# Patient Record
Sex: Male | Born: 1980 | Smoking: Former smoker
Health system: Southern US, Community
[De-identification: ages and names within clinical notes are randomized; demographics above are authoritative.]

---

## 2010-09-18 ENCOUNTER — Emergency Department (HOSPITAL_BASED_OUTPATIENT_CLINIC_OR_DEPARTMENT_OTHER)
Admission: EM | Admit: 2010-09-18 | Discharge: 2010-09-18 | Disposition: A | Discharge status: Home / Self Care | Payer: Self-pay | Attending: Emergency Medicine | Admitting: Emergency Medicine

## 2010-09-18 ENCOUNTER — Emergency Department (INDEPENDENT_AMBULATORY_CARE_PROVIDER_SITE_OTHER): Payer: Self-pay

## 2010-09-18 DIAGNOSIS — S8000XA Contusion of unspecified knee, initial encounter: Secondary | ICD-10-CM | POA: Insufficient documentation

## 2010-09-18 DIAGNOSIS — X58XXXA Exposure to other specified factors, initial encounter: Secondary | ICD-10-CM | POA: Insufficient documentation

## 2010-09-26 ENCOUNTER — Emergency Department (HOSPITAL_BASED_OUTPATIENT_CLINIC_OR_DEPARTMENT_OTHER)
Admission: EM | Admit: 2010-09-26 | Discharge: 2010-09-26 | Disposition: A | Discharge status: Home / Self Care | Payer: Self-pay | Attending: Emergency Medicine | Admitting: Emergency Medicine

## 2010-09-26 DIAGNOSIS — F17200 Nicotine dependence, unspecified, uncomplicated: Secondary | ICD-10-CM | POA: Insufficient documentation

## 2010-09-26 DIAGNOSIS — R109 Unspecified abdominal pain: Secondary | ICD-10-CM | POA: Insufficient documentation

## 2010-09-26 LAB — DIFFERENTIAL
Eosinophils Relative: 5 % (ref 0–5)
Lymphocytes Relative: 15 % (ref 12–46)
Lymphs Abs: 1.3 10*3/uL (ref 0.7–4.0)
Monocytes Relative: 9 % (ref 3–12)

## 2010-09-26 LAB — COMPREHENSIVE METABOLIC PANEL
ALT: 23 U/L (ref 0–53)
Alkaline Phosphatase: 78 U/L (ref 39–117)
BUN: 11 mg/dL (ref 6–23)
CO2: 26 mEq/L (ref 19–32)
GFR calc non Af Amer: >60 mL/min (ref >60)
Glucose, Bld: 100 mg/dL — ABNORMAL HIGH (ref 70–99)
Potassium: 4.3 mEq/L (ref 3.5–5.1)
Sodium: 144 mEq/L (ref 135–145)
Total Bilirubin: 0.7 mg/dL (ref 0.3–1.2)

## 2010-09-26 LAB — CBC
HCT: 43.4 % (ref 39.0–52.0)
MCH: 30.3 pg (ref 26.0–34.0)
MCV: 86.6 fL (ref 78.0–100.0)
RBC: 5.01 MIL/uL (ref 4.22–5.81)
RDW: 13.2 % (ref 11.5–15.5)
WBC: 8.9 10*3/uL (ref 4.0–10.5)

## 2010-09-26 LAB — HEMOCCULT GUIAC POC 1CARD (OFFICE): Fecal Occult Bld: NEGATIVE

## 2010-09-26 LAB — URINALYSIS, ROUTINE W REFLEX MICROSCOPIC
Bilirubin Urine: NEGATIVE
Hgb urine dipstick: NEGATIVE
Ketones, ur: NEGATIVE mg/dL
Specific Gravity, Urine: 1.024 (ref 1.005–1.030)
pH: 5.5 (ref 5.0–8.0)

## 2010-09-26 LAB — LIPASE, BLOOD: Lipase: 128 U/L (ref 23–300)

## 2010-10-09 ENCOUNTER — Emergency Department (HOSPITAL_BASED_OUTPATIENT_CLINIC_OR_DEPARTMENT_OTHER)
Admission: EM | Admit: 2010-10-09 | Discharge: 2010-10-09 | Disposition: A | Discharge status: Home / Self Care | Payer: Self-pay | Attending: Emergency Medicine | Admitting: Emergency Medicine

## 2010-10-09 DIAGNOSIS — F17200 Nicotine dependence, unspecified, uncomplicated: Secondary | ICD-10-CM | POA: Insufficient documentation

## 2010-10-09 DIAGNOSIS — IMO0002 Reserved for concepts with insufficient information to code with codable children: Secondary | ICD-10-CM | POA: Insufficient documentation

## 2010-10-26 ENCOUNTER — Emergency Department (HOSPITAL_BASED_OUTPATIENT_CLINIC_OR_DEPARTMENT_OTHER): Payer: Self-pay

## 2010-10-26 ENCOUNTER — Emergency Department (HOSPITAL_BASED_OUTPATIENT_CLINIC_OR_DEPARTMENT_OTHER)
Admission: EM | Admit: 2010-10-26 | Discharge: 2010-10-26 | Disposition: A | Discharge status: Home / Self Care | Payer: Self-pay | Attending: Emergency Medicine | Admitting: Emergency Medicine

## 2010-10-26 DIAGNOSIS — R197 Diarrhea, unspecified: Secondary | ICD-10-CM | POA: Insufficient documentation

## 2010-10-26 DIAGNOSIS — F17200 Nicotine dependence, unspecified, uncomplicated: Secondary | ICD-10-CM | POA: Insufficient documentation

## 2010-10-26 DIAGNOSIS — R109 Unspecified abdominal pain: Secondary | ICD-10-CM | POA: Insufficient documentation

## 2010-10-26 DIAGNOSIS — R112 Nausea with vomiting, unspecified: Secondary | ICD-10-CM | POA: Insufficient documentation

## 2010-10-26 LAB — COMPREHENSIVE METABOLIC PANEL
Albumin: 4.6 g/dL (ref 3.5–5.2)
Alkaline Phosphatase: 87 U/L (ref 39–117)
BUN: 11 mg/dL (ref 6–23)
GFR calc Af Amer: >60 mL/min (ref >60)
Potassium: 4.4 mEq/L (ref 3.5–5.1)
Sodium: 146 mEq/L — ABNORMAL HIGH (ref 135–145)
Total Protein: 8.5 g/dL — ABNORMAL HIGH (ref 6.0–8.3)

## 2010-10-26 LAB — DIFFERENTIAL
Basophils Absolute: 0 10*3/uL (ref 0.0–0.1)
Basophils Relative: 0 % (ref 0–1)
Eosinophils Absolute: 0.3 10*3/uL (ref 0.0–0.7)
Eosinophils Relative: 2 % (ref 0–5)

## 2010-10-26 LAB — CBC
MCV: 86.9 fL (ref 78.0–100.0)
Platelets: 253 10*3/uL (ref 150–400)
RDW: 12.7 % (ref 11.5–15.5)
WBC: 13.5 10*3/uL — ABNORMAL HIGH (ref 4.0–10.5)

## 2010-10-26 LAB — URINALYSIS, ROUTINE W REFLEX MICROSCOPIC
Glucose, UA: NEGATIVE mg/dL
Ketones, ur: NEGATIVE mg/dL
pH: 7.5 (ref 5.0–8.0)

## 2010-10-26 MED ORDER — IOHEXOL 300 MG/ML  SOLN: 100.0000 mL | Freq: Once | INTRAMUSCULAR | Status: DC | PRN

## 2010-12-19 ENCOUNTER — Emergency Department (HOSPITAL_BASED_OUTPATIENT_CLINIC_OR_DEPARTMENT_OTHER)
Admission: EM | Admit: 2010-12-19 | Discharge: 2010-12-19 | Disposition: A | Discharge status: Home / Self Care | Payer: Self-pay | Attending: Emergency Medicine | Admitting: Emergency Medicine

## 2010-12-19 DIAGNOSIS — IMO0002 Reserved for concepts with insufficient information to code with codable children: Secondary | ICD-10-CM | POA: Insufficient documentation

## 2010-12-19 DIAGNOSIS — F17200 Nicotine dependence, unspecified, uncomplicated: Secondary | ICD-10-CM | POA: Insufficient documentation

## 2011-01-01 ENCOUNTER — Emergency Department (HOSPITAL_BASED_OUTPATIENT_CLINIC_OR_DEPARTMENT_OTHER)
Admission: EM | Admit: 2011-01-01 | Discharge: 2011-01-01 | Disposition: A | Discharge status: Home / Self Care | Payer: Self-pay | Attending: Emergency Medicine | Admitting: Emergency Medicine

## 2011-01-01 DIAGNOSIS — K0381 Cracked tooth: Secondary | ICD-10-CM | POA: Insufficient documentation

## 2011-01-01 DIAGNOSIS — IMO0002 Reserved for concepts with insufficient information to code with codable children: Secondary | ICD-10-CM | POA: Insufficient documentation

## 2011-01-01 DIAGNOSIS — X58XXXA Exposure to other specified factors, initial encounter: Secondary | ICD-10-CM | POA: Insufficient documentation

## 2012-10-01 ENCOUNTER — Emergency Department (HOSPITAL_BASED_OUTPATIENT_CLINIC_OR_DEPARTMENT_OTHER)
Admission: EM | Admit: 2012-10-01 | Discharge: 2012-10-01 | Disposition: A | Discharge status: Home / Self Care | Payer: Self-pay | Attending: Emergency Medicine | Admitting: Emergency Medicine

## 2012-10-01 ENCOUNTER — Emergency Department (HOSPITAL_BASED_OUTPATIENT_CLINIC_OR_DEPARTMENT_OTHER): Payer: Self-pay

## 2012-10-01 ENCOUNTER — Encounter (HOSPITAL_BASED_OUTPATIENT_CLINIC_OR_DEPARTMENT_OTHER): Payer: Self-pay | Admitting: *Deleted

## 2012-10-01 DIAGNOSIS — F17200 Nicotine dependence, unspecified, uncomplicated: Secondary | ICD-10-CM | POA: Insufficient documentation

## 2012-10-01 DIAGNOSIS — R112 Nausea with vomiting, unspecified: Secondary | ICD-10-CM | POA: Insufficient documentation

## 2012-10-01 DIAGNOSIS — J189 Pneumonia, unspecified organism: Secondary | ICD-10-CM

## 2012-10-01 DIAGNOSIS — R062 Wheezing: Secondary | ICD-10-CM | POA: Insufficient documentation

## 2012-10-01 DIAGNOSIS — R509 Fever, unspecified: Secondary | ICD-10-CM | POA: Insufficient documentation

## 2012-10-01 DIAGNOSIS — R29898 Other symptoms and signs involving the musculoskeletal system: Secondary | ICD-10-CM | POA: Insufficient documentation

## 2012-10-01 DIAGNOSIS — R630 Anorexia: Secondary | ICD-10-CM | POA: Insufficient documentation

## 2012-10-01 DIAGNOSIS — J159 Unspecified bacterial pneumonia: Secondary | ICD-10-CM | POA: Insufficient documentation

## 2012-10-01 DIAGNOSIS — IMO0001 Reserved for inherently not codable concepts without codable children: Secondary | ICD-10-CM | POA: Insufficient documentation

## 2012-10-01 DIAGNOSIS — R609 Edema, unspecified: Secondary | ICD-10-CM | POA: Insufficient documentation

## 2012-10-01 DIAGNOSIS — R5381 Other malaise: Secondary | ICD-10-CM | POA: Insufficient documentation

## 2012-10-01 DIAGNOSIS — R599 Enlarged lymph nodes, unspecified: Secondary | ICD-10-CM | POA: Insufficient documentation

## 2012-10-01 DIAGNOSIS — R51 Headache: Secondary | ICD-10-CM | POA: Insufficient documentation

## 2012-10-01 DIAGNOSIS — R0789 Other chest pain: Secondary | ICD-10-CM | POA: Insufficient documentation

## 2012-10-01 DIAGNOSIS — J3489 Other specified disorders of nose and nasal sinuses: Secondary | ICD-10-CM | POA: Insufficient documentation

## 2012-10-01 DIAGNOSIS — J029 Acute pharyngitis, unspecified: Secondary | ICD-10-CM | POA: Insufficient documentation

## 2012-10-01 MED ORDER — IBUPROFEN 800 MG PO TABS
800.0000 mg | ORAL_TABLET | Freq: Once | ORAL | Status: AC
  Administered 2012-10-01: 800 mg via ORAL
  Filled 2012-10-01: qty 1

## 2012-10-01 MED ORDER — ALBUTEROL SULFATE (5 MG/ML) 0.5% IN NEBU
5.0000 mg | INHALATION_SOLUTION | Freq: Once | RESPIRATORY_TRACT | Status: AC
  Administered 2012-10-01: 5 mg via RESPIRATORY_TRACT
  Filled 2012-10-01: qty 1

## 2012-10-01 MED ORDER — SODIUM CHLORIDE 0.9 % IV BOLUS (SEPSIS)
1000.0000 mL | Freq: Once | INTRAVENOUS | Status: AC
  Administered 2012-10-01: 1000 mL via INTRAVENOUS

## 2012-10-01 MED ORDER — LEVOFLOXACIN 500 MG PO TABS: 500.0000 mg | ORAL_TABLET | Freq: Every day | ORAL | Status: DC

## 2012-10-01 MED ORDER — LEVOFLOXACIN 500 MG PO TABS
500.0000 mg | ORAL_TABLET | Freq: Once | ORAL | Status: AC
  Administered 2012-10-01: 500 mg via ORAL
  Filled 2012-10-01: qty 1

## 2012-10-01 NOTE — ED Notes (Signed)
States he feels like he has a fever. Temp 98.7

## 2012-10-01 NOTE — ED Notes (Signed)
Cough fever headache fever and chills.

## 2012-10-01 NOTE — ED Provider Notes (Signed)
History     CSN: [id number]  Arrival date & time 10/01/12  1939   First MD Initiated Contact with Patient 10/01/12 1944      Chief Complaint  Patient presents with  . Cough    (Consider location/radiation/quality/duration/timing/severity/associated sxs/prior treatment) HPI Comments: 32 y/o male presents to the ED complaining of cough, headache, subjective fever and chills x 5 days. Cough is productive with dark brown/green phlegm. Admits to associated nausea, vomiting, myalgias and arthralgias. He vomited a few times over the past 5 days and has a decreased appetite. Also complaining of congestion and sore throat. Has been taking BC powders without any relief. Denies sick contacts. Did not have flu vaccine this year.  Patient is a 32 y.o. male presenting with cough. The history is provided by the patient.  Cough Associated symptoms: chills, fever, headaches, myalgias and sore throat   Associated symptoms: no chest pain, no ear pain and no rash     History reviewed. No pertinent past medical history.  History reviewed. No pertinent past surgical history.  No family history on file.  History  Substance Use Topics  . Smoking status: Current Some Day Smoker  . Smokeless tobacco: Not on file  . Alcohol Use: No      Review of Systems  Constitutional: Positive for fever, chills, appetite change and fatigue.  HENT: Positive for sore throat. Negative for ear pain, trouble swallowing, neck pain and neck stiffness.   Respiratory: Positive for cough and chest tightness.   Cardiovascular: Negative for chest pain.  Gastrointestinal: Positive for nausea and vomiting.  Musculoskeletal: Positive for myalgias and arthralgias.  Skin: Negative for rash.  Neurological: Positive for weakness and headaches.  All other systems reviewed and are negative.    Allergies  Review of patient's allergies indicates no known allergies.  Home Medications  No current outpatient prescriptions on  file.  BP 152/85  Pulse 92  Temp(Src) 99.6 F (37.6 C) (Oral)  Resp 18  SpO2 96%  Physical Exam  Nursing note and vitals reviewed. Constitutional: He is oriented to person, place, and time. He appears well-developed and well-nourished. No distress.  Appears fatigued.  HENT:  Head: Normocephalic and atraumatic.  Nose: Mucosal edema present. Right sinus exhibits frontal sinus tenderness. Left sinus exhibits frontal sinus tenderness.  Mouth/Throat: Uvula is midline and mucous membranes are normal. Posterior oropharyngeal erythema present. No oropharyngeal exudate or posterior oropharyngeal edema.  Eyes: Conjunctivae are normal.  Neck: Normal range of motion. Neck supple.  Cardiovascular: Normal rate, regular rhythm, normal heart sounds and intact distal pulses.   Pulmonary/Chest: Effort normal. No respiratory distress. He has wheezes (scattered expiratory). He has rhonchi (scattered, more prominent left lower lung fields).  Abdominal: Soft. Bowel sounds are normal. He exhibits no distension. There is no tenderness.  Musculoskeletal: Normal range of motion. He exhibits no edema.  Lymphadenopathy:    He has cervical adenopathy.  Neurological: He is alert and oriented to person, place, and time.  Skin: Skin is warm and dry. No rash noted. He is not diaphoretic.  Psychiatric: He has a normal mood and affect. His behavior is normal.    ED Course  Procedures (including critical care time)  Labs Reviewed - No data to display Dg Chest 2 View  10/01/2012  *RADIOLOGY REPORT*  Clinical Data: Cough.  Chest congestion.  Shortness of breath.  4- day history of these symptoms.  CHEST - 2 VIEW  Comparison: None.  Findings: Suboptimal inspiration accounts for crowded bronchovascular markings, especially  in the lung bases, and accentuates the cardiac silhouette.  Taking this into account, cardiomediastinal silhouette unremarkable.  Airspace consolidation in the posteromedial left lower lobe.  Lungs  otherwise clear.  No pleural effusions.  Visualized bony thorax intact.  IMPRESSION: Suboptimal inspiration.  Left lower lobe pneumonia.   Original Report Authenticated By: Hulan Saas, M.D.      1. CAP (community acquired pneumonia)       MDM  32 y/o male with CAP. He does not meet criteria for admission, low PORT score of 31. Rx levaquin. No respiratory distress. Return precautions discussed. Patient states understanding of plan and is agreeable. Resource guide given for PCP follow up in 1 week for recheck.       Trevor Mace, PA-C 10/01/12 2206

## 2012-10-01 NOTE — ED Provider Notes (Signed)
Medical screening examination/treatment/procedure(s) were performed by non-physician practitioner and as supervising physician I was immediately available for consultation/collaboration.   Gwyneth Sprout, MD 10/01/12 2317

## 2012-10-02 ENCOUNTER — Telehealth (HOSPITAL_BASED_OUTPATIENT_CLINIC_OR_DEPARTMENT_OTHER): Payer: Self-pay | Admitting: *Deleted

## 2012-10-02 NOTE — ED Notes (Signed)
Mrs. Retzloff called and stated that her husband, Cody Banks, was seen here last night and given a prescription for Levaquin for CAP.  States the prescription cost 110.00 and they can not afford it.  Requests to have a different cheaper prescription.  Chart reviewed with Dr. Judd Lien.  Order received for a Zpack.  Prescription was written by Dr. Judd Lien and left out front for pick up.  Call returned to Mrs. Sebree @ [phone] with instructions for use.  Verbalized understanding.

## 2012-10-22 ENCOUNTER — Emergency Department (HOSPITAL_BASED_OUTPATIENT_CLINIC_OR_DEPARTMENT_OTHER): Payer: Self-pay

## 2012-10-22 ENCOUNTER — Encounter (HOSPITAL_BASED_OUTPATIENT_CLINIC_OR_DEPARTMENT_OTHER): Payer: Self-pay | Admitting: *Deleted

## 2012-10-22 ENCOUNTER — Emergency Department (HOSPITAL_BASED_OUTPATIENT_CLINIC_OR_DEPARTMENT_OTHER)
Admission: EM | Admit: 2012-10-22 | Discharge: 2012-10-23 | Disposition: A | Discharge status: Home / Self Care | Payer: Self-pay | Attending: Emergency Medicine | Admitting: Emergency Medicine

## 2012-10-22 DIAGNOSIS — R197 Diarrhea, unspecified: Secondary | ICD-10-CM | POA: Insufficient documentation

## 2012-10-22 DIAGNOSIS — Z87891 Personal history of nicotine dependence: Secondary | ICD-10-CM | POA: Insufficient documentation

## 2012-10-22 DIAGNOSIS — Z8701 Personal history of pneumonia (recurrent): Secondary | ICD-10-CM | POA: Insufficient documentation

## 2012-10-22 DIAGNOSIS — R509 Fever, unspecified: Secondary | ICD-10-CM | POA: Insufficient documentation

## 2012-10-22 DIAGNOSIS — R000 Tachycardia, unspecified: Secondary | ICD-10-CM | POA: Insufficient documentation

## 2012-10-22 DIAGNOSIS — R112 Nausea with vomiting, unspecified: Secondary | ICD-10-CM | POA: Insufficient documentation

## 2012-10-22 DIAGNOSIS — J020 Streptococcal pharyngitis: Secondary | ICD-10-CM | POA: Insufficient documentation

## 2012-10-22 LAB — COMPREHENSIVE METABOLIC PANEL
ALT: 45 U/L (ref 0–53)
AST: 22 U/L (ref 0–37)
Albumin: 4.1 g/dL (ref 3.5–5.2)
Alkaline Phosphatase: 79 U/L (ref 39–117)
Chloride: 100 mEq/L (ref 96–112)
Potassium: 3.7 mEq/L (ref 3.5–5.1)
Total Bilirubin: 0.5 mg/dL (ref 0.3–1.2)

## 2012-10-22 LAB — RAPID STREP SCREEN (MED CTR MEBANE ONLY): Streptococcus, Group A Screen (Direct): POSITIVE — AB

## 2012-10-22 MED ORDER — PENICILLIN G BENZATHINE 1200000 UNIT/2ML IM SUSP
1.2000 10*6.[IU] | Freq: Once | INTRAMUSCULAR | Status: AC
  Administered 2012-10-23: 1.2 10*6.[IU] via INTRAMUSCULAR
  Filled 2012-10-22: qty 2

## 2012-10-22 MED ORDER — IBUPROFEN 800 MG PO TABS
ORAL_TABLET | ORAL | Status: AC
  Filled 2012-10-22: qty 1

## 2012-10-22 MED ORDER — KETOROLAC TROMETHAMINE 30 MG/ML IJ SOLN
30.0000 mg | Freq: Once | INTRAMUSCULAR | Status: AC
  Administered 2012-10-22: 30 mg via INTRAVENOUS
  Filled 2012-10-22: qty 1

## 2012-10-22 MED ORDER — ONDANSETRON HCL 4 MG/2ML IJ SOLN
4.0000 mg | Freq: Once | INTRAMUSCULAR | Status: AC
  Administered 2012-10-22: 4 mg via INTRAVENOUS
  Filled 2012-10-22: qty 2

## 2012-10-22 MED ORDER — IBUPROFEN 800 MG PO TABS
800.0000 mg | ORAL_TABLET | Freq: Once | ORAL | Status: AC
  Administered 2012-10-22: 800 mg via ORAL

## 2012-10-22 MED ORDER — SODIUM CHLORIDE 0.9 % IV BOLUS (SEPSIS)
1000.0000 mL | Freq: Once | INTRAVENOUS | Status: AC
  Administered 2012-10-22: 1000 mL via INTRAVENOUS

## 2012-10-22 NOTE — ED Notes (Signed)
Pt c/o n/v/d with sore throat and h/a x 1 day

## 2012-10-22 NOTE — ED Provider Notes (Signed)
History    This chart was scribed for Glynn Octave, MD by Leone Payor, ED Scribe. This patient was seen in room MH07/[facility]07 and the patient's care was started 10:14 PM.   CSN: [id number]  Arrival date & time 10/22/12  1949   First MD Initiated Contact with Patient 10/22/12 2200      Chief Complaint  Patient presents with  . Emesis  . Sore Throat  . Headache     The history is provided by the patient. No language interpreter was used.    Cody Banks is a 32 y.o. male who presents to the Emergency Department complaining of new, constant, unchanged nausea, vomiting, diarrhea starting 1-2 days ago. Pt states he has vomited about 10-15 times. He has associated fever, gradual onset HA, and sore throat. Pt was seen for pneumonia last month. He denies having any sick contacts. He denies abdominal pain.   Pt is a former smoker but denies alcohol use.  History reviewed. No pertinent past medical history.  History reviewed. No pertinent past surgical history.  History reviewed. No pertinent family history.  History  Substance Use Topics  . Smoking status: Former Games developer  . Smokeless tobacco: Not on file  . Alcohol Use: No      Review of Systems A complete 10 system review of systems was obtained and all systems are negative except as noted in the HPI and PMH.   Allergies  Review of patient's allergies indicates no known allergies.  Home Medications   Current Outpatient Rx  Name  Route  Sig  Dispense  Refill  . HYDROcodone-acetaminophen (LORTAB) 7.5-500 MG/15ML solution   Oral   Take 15 mLs by mouth every 6 (six) hours as needed for pain.   120 mL   0   . levofloxacin (LEVAQUIN) 500 MG tablet   Oral   Take 1 tablet (500 mg total) by mouth daily.   7 tablet   0     BP 125/74  Pulse 84  Temp(Src) 99.5 F (37.5 C) (Oral)  Resp 16  Ht 6\' 1"  (1.854 m)  Wt 305 lb (138.347 kg)  BMI 40.25 kg/m2  SpO2 100%  Physical Exam  Nursing note and vitals  reviewed. Constitutional: He appears well-developed and well-nourished.  HENT:  Head: Normocephalic and atraumatic.  Right Ear: External ear normal.  Left Ear: External ear normal.  Mouth/Throat: Oropharyngeal exudate present.  Bilateral tonsillar exudate. Uvula midline, no asymmetry.  Eyes: Conjunctivae are normal. Pupils are equal, round, and reactive to light.  Neck: Neck supple. No tracheal deviation present. No thyromegaly present.  No meningismus  Cardiovascular: Regular rhythm.   No murmur heard. tachycardia  Pulmonary/Chest: Effort normal and breath sounds normal.  Abdominal: Soft. Bowel sounds are normal. He exhibits no distension. There is no tenderness.  Musculoskeletal: Normal range of motion. He exhibits no edema and no tenderness.  Neurological: He is alert. Coordination normal.  Skin: Skin is warm and dry. No rash noted.  Psychiatric: He has a normal mood and affect.    ED Course  Procedures (including critical care time)  DIAGNOSTIC STUDIES: Oxygen Saturation is 100% on room air, normal by my interpretation.    COORDINATION OF CARE: 10:14 PM-Discussed treatment plan with pt at bedside and pt agreed to plan.    Labs Reviewed  RAPID STREP SCREEN - Abnormal; Notable for the following:    Streptococcus, Group A Screen (Direct) POSITIVE (*)    All other components within normal limits  COMPREHENSIVE METABOLIC PANEL -  Abnormal; Notable for the following:    Glucose, Bld 110 (*)    GFR calc non Af Amer 88 (*)    All other components within normal limits  CBC WITH DIFFERENTIAL   Dg Chest 2 View  10/22/2012  *RADIOLOGY REPORT*  Clinical Data: Headache.  Sore throat.  Emesis.  CHEST - 2 VIEW  Comparison: 10/01/2012  Findings: Left lower lobe airspace opacity improved but not totally resolved. Low lung volumes are present, causing crowding of the pulmonary vasculature.  Cardiac and mediastinal contours appear unremarkable.  No pleural effusion noted.  IMPRESSION:  1.   Band-like left lower lobe airspace opacity improved but not completely resolved.  This may reflect a small amount of residual pneumonia.   Original Report Authenticated By: Gaylyn Rong, M.D.       1. Streptococcal pharyngitis       MDM  2 day history of fever, vomiting, sore throat, headache.  Treated for PNA a month ago. Febrile and tachycardic on arrival. Headache gradual onset.  OP with bilateral exudates, no asymmetry.  Lungs clear, no meningismus.  IVF, bicillin, antiemetics. Improvement in HR with fluids and antipyretics. Now 84 from 120s.  No CP or SOB. Treat for strep. Oral hydration at home. No vomiting in ED. Return precautions discussed.      I personally performed the services described in this documentation, which was scribed in my presence. The recorded information has been reviewed and is accurate.   Glynn Octave, MD 10/23/12 1134

## 2012-10-23 MED ORDER — HYDROCODONE-ACETAMINOPHEN 7.5-500 MG/15ML PO SOLN: 15.0000 mL | Freq: Four times a day (QID) | ORAL | Status: DC | PRN

## 2012-10-23 MED ORDER — IBUPROFEN 800 MG PO TABS
ORAL_TABLET | ORAL | Status: AC
  Administered 2012-10-23: 800 mg
  Filled 2012-10-23: qty 1

## 2013-05-24 IMAGING — CR DG CHEST 2V
2 series · 2 of 2 positions shown · non-contrast
Comparison: 10/01/2012

CLINICAL DATA: Headache.  Sore throat.  Emesis.

CHEST - 2 VIEW

[w chest pa]
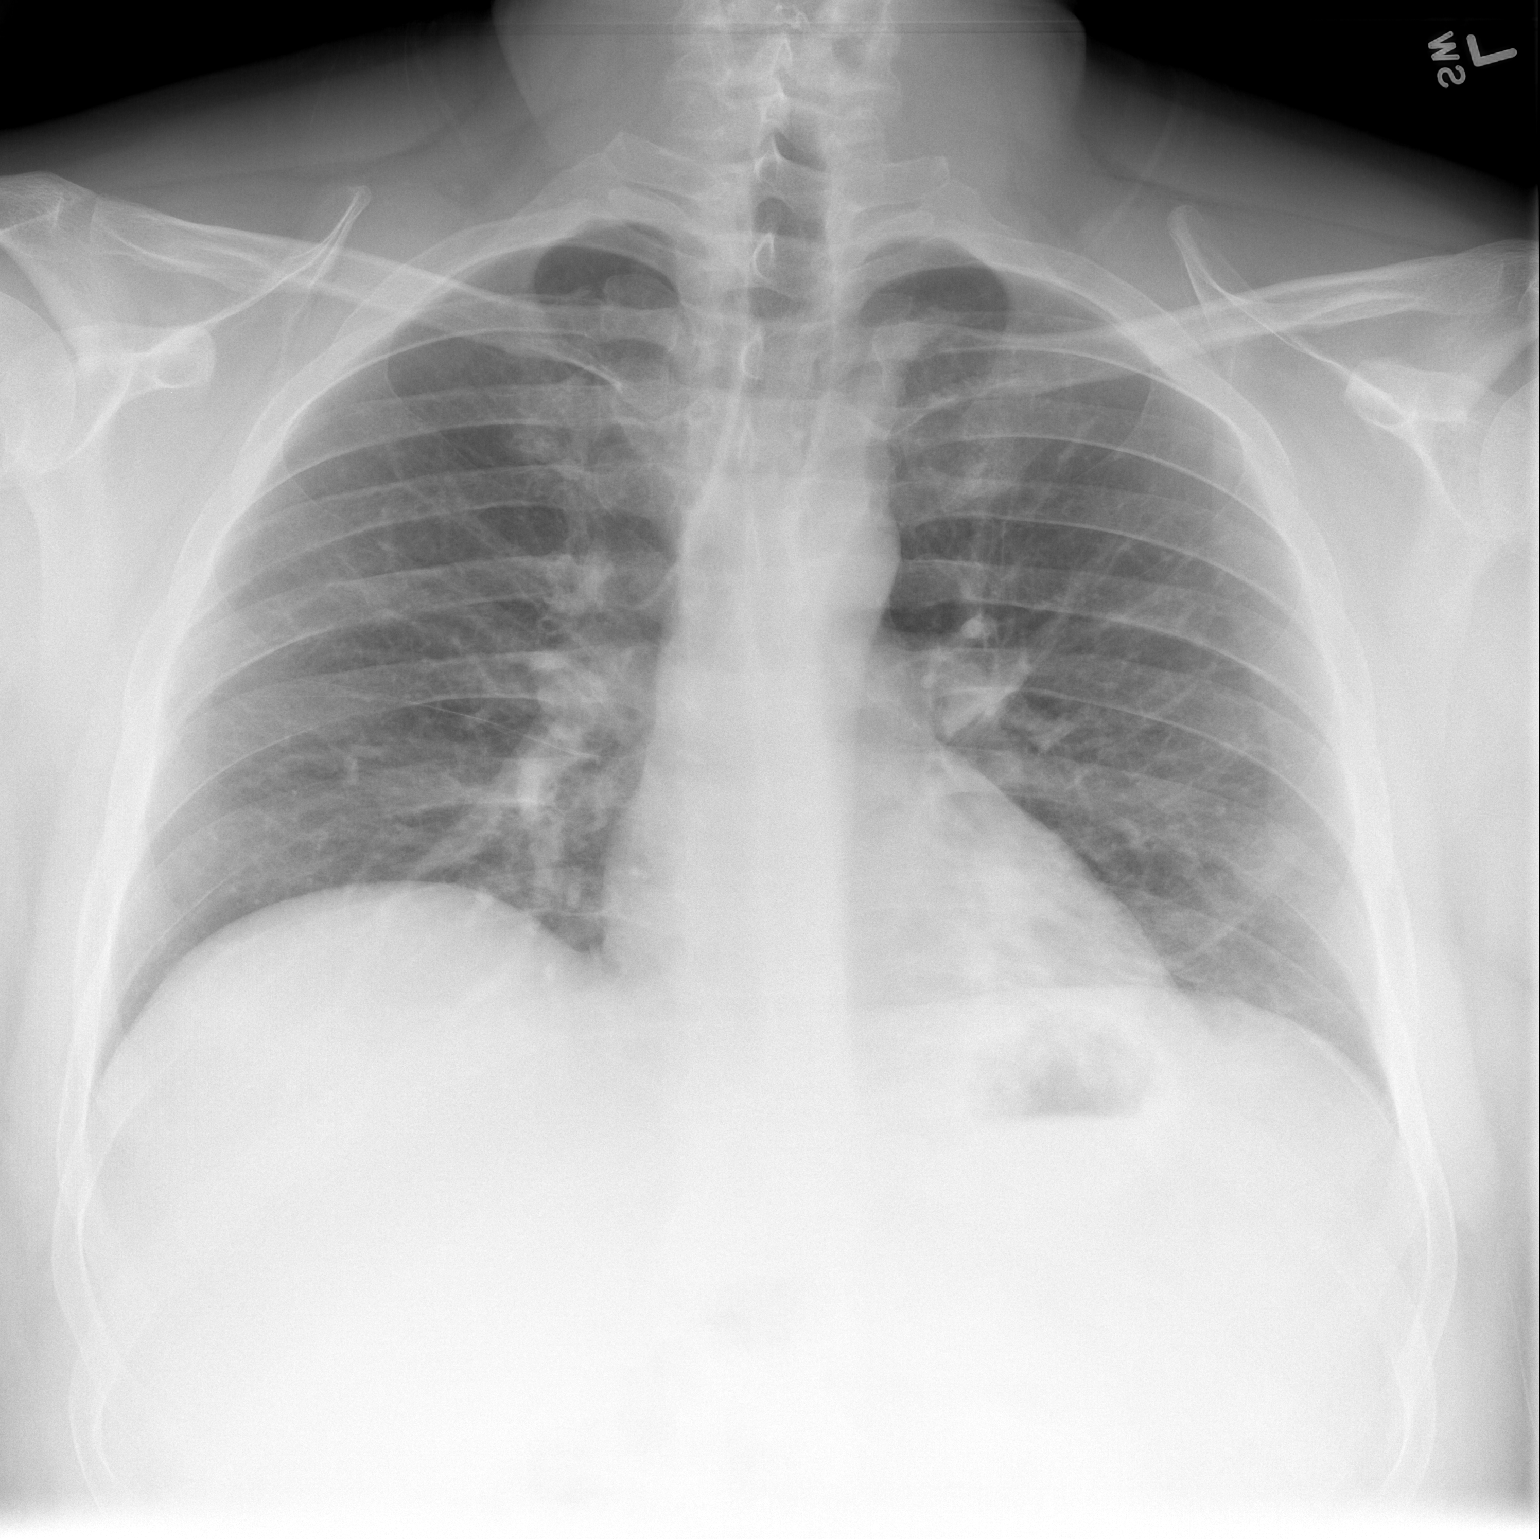

[w chest lat]
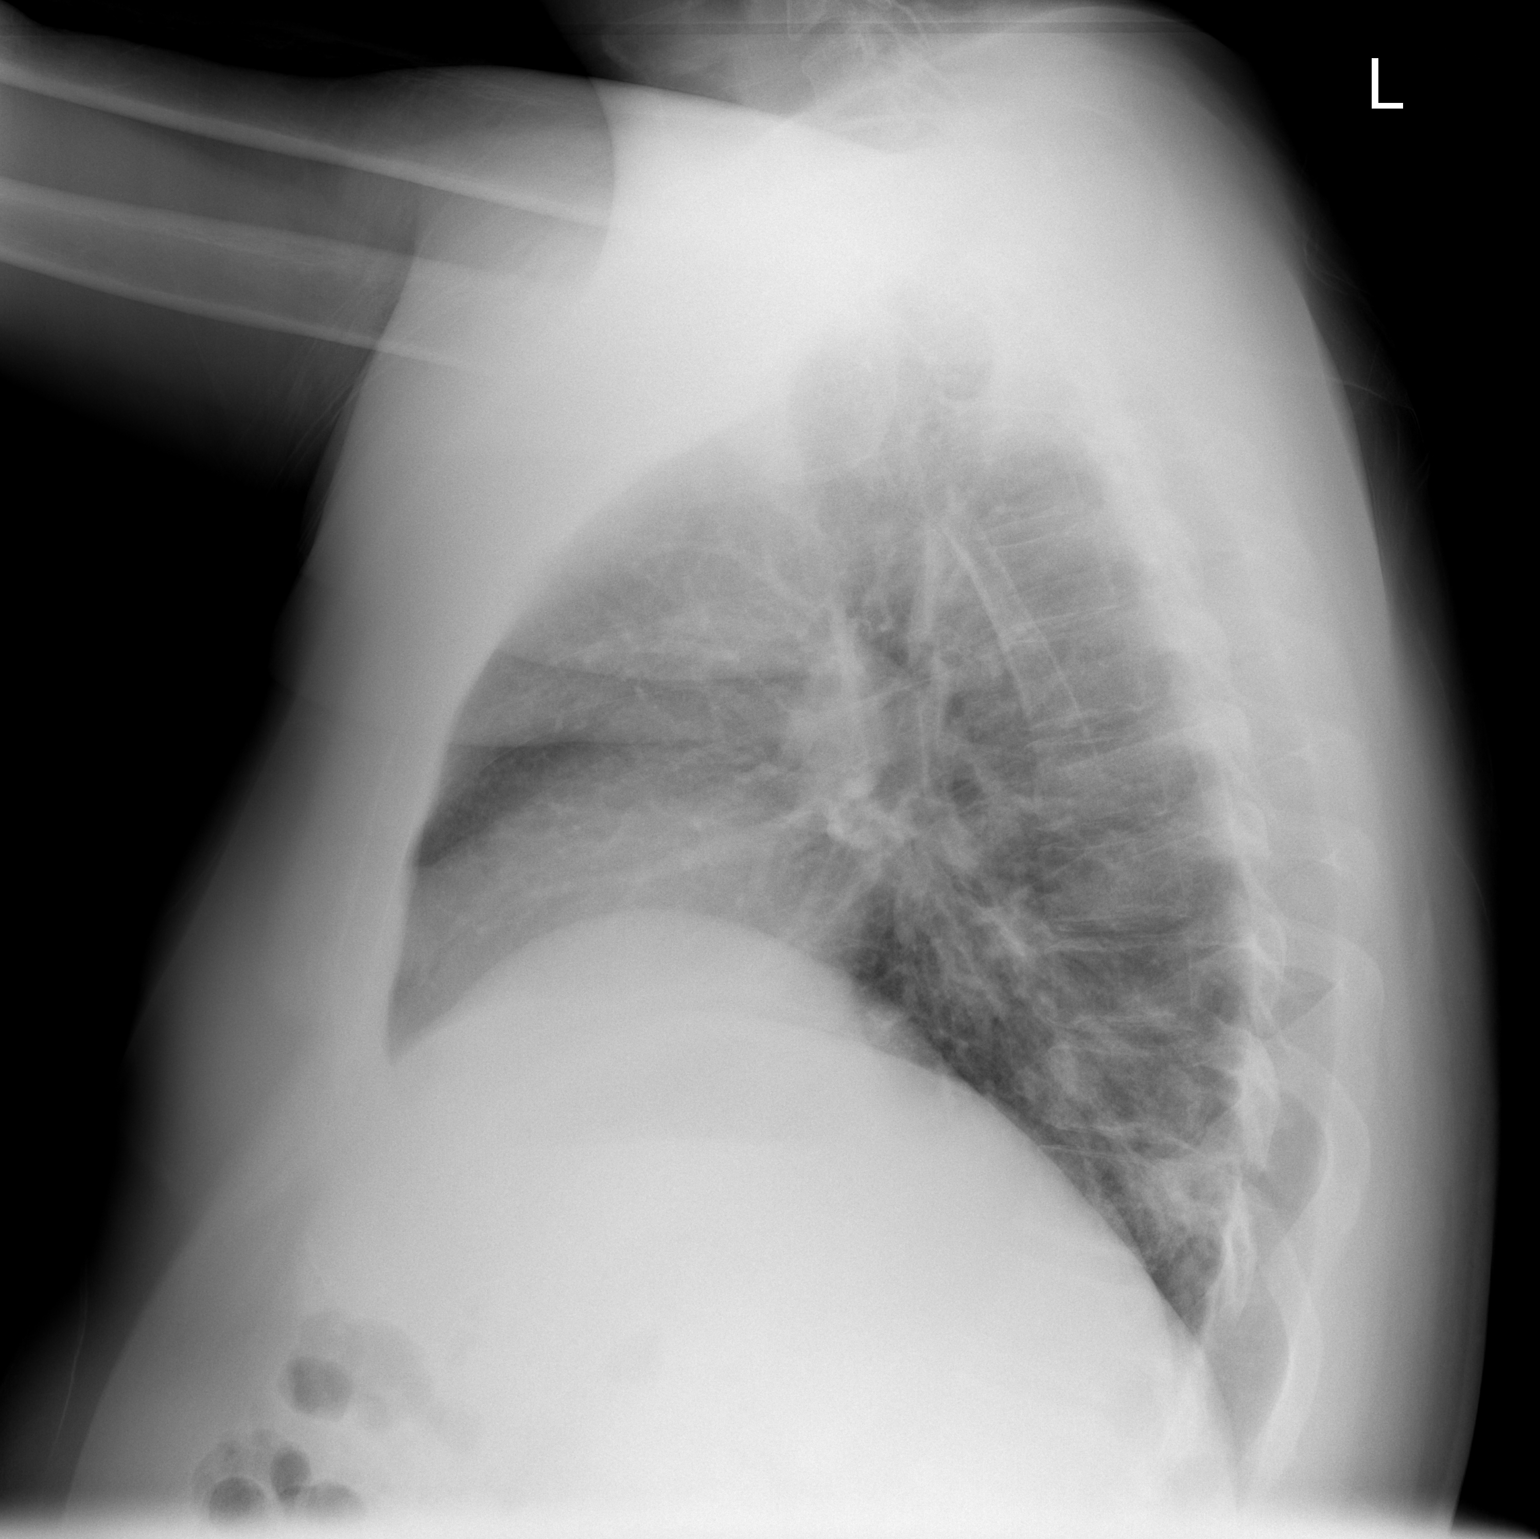

[2 of 2 positions shown; findings below may reference images not displayed]

FINDINGS: Left lower lobe airspace opacity improved but not totally
resolved. Low lung volumes are present, causing crowding of the
pulmonary vasculature.  Cardiac and mediastinal contours appear
unremarkable.

No pleural effusion noted.
IMPRESSION: 1.  Band-like left lower lobe airspace opacity improved but not
completely resolved.  This may reflect a small amount of residual
pneumonia.

## 2013-12-02 ENCOUNTER — Encounter (HOSPITAL_BASED_OUTPATIENT_CLINIC_OR_DEPARTMENT_OTHER): Payer: Self-pay | Admitting: Emergency Medicine

## 2013-12-02 ENCOUNTER — Emergency Department (HOSPITAL_BASED_OUTPATIENT_CLINIC_OR_DEPARTMENT_OTHER)
Admission: EM | Admit: 2013-12-02 | Discharge: 2013-12-02 | Disposition: A | Discharge status: Home / Self Care | Payer: Self-pay | Attending: Emergency Medicine | Admitting: Emergency Medicine

## 2013-12-02 DIAGNOSIS — M542 Cervicalgia: Secondary | ICD-10-CM | POA: Insufficient documentation

## 2013-12-02 DIAGNOSIS — R5383 Other fatigue: Secondary | ICD-10-CM

## 2013-12-02 DIAGNOSIS — L0201 Cutaneous abscess of face: Secondary | ICD-10-CM | POA: Insufficient documentation

## 2013-12-02 DIAGNOSIS — Z87891 Personal history of nicotine dependence: Secondary | ICD-10-CM | POA: Insufficient documentation

## 2013-12-02 DIAGNOSIS — R5381 Other malaise: Secondary | ICD-10-CM | POA: Insufficient documentation

## 2013-12-02 DIAGNOSIS — L03211 Cellulitis of face: Principal | ICD-10-CM

## 2013-12-02 DIAGNOSIS — Z79899 Other long term (current) drug therapy: Secondary | ICD-10-CM | POA: Insufficient documentation

## 2013-12-02 DIAGNOSIS — R509 Fever, unspecified: Secondary | ICD-10-CM | POA: Insufficient documentation

## 2013-12-02 DIAGNOSIS — R638 Other symptoms and signs concerning food and fluid intake: Secondary | ICD-10-CM | POA: Insufficient documentation

## 2013-12-02 DIAGNOSIS — Z791 Long term (current) use of non-steroidal anti-inflammatories (NSAID): Secondary | ICD-10-CM | POA: Insufficient documentation

## 2013-12-02 MED ORDER — CEPHALEXIN 500 MG PO CAPS: 500.0000 mg | ORAL_CAPSULE | Freq: Two times a day (BID) | ORAL | Status: DC

## 2013-12-02 MED ORDER — SULFAMETHOXAZOLE-TRIMETHOPRIM 800-160 MG PO TABS: 1.0000 | ORAL_TABLET | Freq: Two times a day (BID) | ORAL | Status: DC

## 2013-12-02 MED ORDER — SODIUM CHLORIDE 0.9 % IV BOLUS (SEPSIS): 1000.0000 mL | Freq: Once | INTRAVENOUS | Status: DC

## 2013-12-02 MED ORDER — SULFAMETHOXAZOLE-TMP DS 800-160 MG PO TABS
1.0000 | ORAL_TABLET | Freq: Once | ORAL | Status: AC
  Administered 2013-12-02: 1 via ORAL
  Filled 2013-12-02: qty 1

## 2013-12-02 MED ORDER — ACETAMINOPHEN 500 MG PO TABS
1000.0000 mg | ORAL_TABLET | Freq: Once | ORAL | Status: AC
  Administered 2013-12-02: 1000 mg via ORAL
  Filled 2013-12-02: qty 2

## 2013-12-02 MED ORDER — CEPHALEXIN 250 MG PO CAPS
500.0000 mg | ORAL_CAPSULE | Freq: Once | ORAL | Status: AC
  Administered 2013-12-02: 500 mg via ORAL
  Filled 2013-12-02: qty 2

## 2013-12-02 NOTE — ED Provider Notes (Signed)
CSN: [id number]     Arrival date & time 12/02/13  1910 History   First MD Initiated Contact with Patient 12/02/13 2107     Chief Complaint  Patient presents with  . Abscess   HPI  Cody Banks is a 33 y.o. male with no PMH who presents to the ED for evaluation of an abscess.  History was provided by the patient. Patient states he has had an abscess to the right side of his face for 3 days. Abscess is in front of his right ear. States he has had a knot in the area for 3-4 years, but it worsened after cutting himself with a razor last week. States it has been getting progressively more painful, swollen, and red. He states he tried popping it with some purulent drainage. He tried taking some Keflex from his mom's leftover prescription but this did not help. He states he has not been feeling well with fevers (max 101), chills, fatigue, myalgias, and generalized weakness. Patient states he has had abscess in the past. He denies any sore throat, rhinorrhea, ear pain, ear discharge, dizziness, lightheadedness, trismus, or difficulty swallowing/breathing. He states he has had spreading swelling to his neck. No neck stiffness.    History reviewed. No pertinent past medical history. History reviewed. No pertinent past surgical history. History reviewed. No pertinent family history. History  Substance Use Topics  . Smoking status: Former Games developer  . Smokeless tobacco: Not on file  . Alcohol Use: No    Review of Systems  Constitutional: Positive for fever, chills, activity change, appetite change and fatigue.  HENT: Negative for congestion, ear discharge, ear pain, rhinorrhea, sore throat, trouble swallowing and voice change.   Respiratory: Negative for cough and shortness of breath.   Cardiovascular: Negative for chest pain and leg swelling.  Gastrointestinal: Negative for nausea, vomiting and abdominal pain.  Musculoskeletal: Positive for myalgias and neck pain. Negative for neck stiffness.  Skin:  Positive for color change. Negative for wound.  Neurological: Positive for weakness (generalized ) and headaches. Negative for dizziness and light-headedness.    Allergies  Review of patient's allergies indicates no known allergies.  Home Medications   Prior to Admission medications   Medication Sig Start Date End Date Taking? Authorizing Provider  FLUoxetine (PROZAC) 40 MG capsule Take 40 mg by mouth daily.   Yes Historical Provider, MD  meloxicam (MOBIC) 7.5 MG tablet Take 7.5 mg by mouth daily.   Yes Historical Provider, MD   BP 157/87  Pulse 92  Temp(Src) 100.6 F (38.1 C)  Resp 16  Ht 6\' 1"  (1.854 m)  Wt 260 lb (117.935 kg)  BMI 34.31 kg/m2  SpO2 99%  Filed Vitals:   12/02/13 1922 12/02/13 2242  BP: 157/87 117/70  Pulse: 92 90  Temp: 100.6 F (38.1 C) 98.9 F (37.2 C)  TempSrc:  Oral  Resp: 16 16  Height: 6\' 1"  (1.854 m)   Weight: 260 lb (117.935 kg)   SpO2: 99% 96%    Physical Exam  Nursing note and vitals reviewed. Constitutional: He is oriented to person, place, and time. He appears well-developed and well-nourished. No distress.  Non-toxic  HENT:  Head: Normocephalic and atraumatic.    Left Ear: External ear normal.  Nose: Nose normal.  Mouth/Throat: Oropharynx is clear and moist.  Large superficial 4 cm x 4 cm circular area of erythema, edema and fluctuance to the right pre-auricular area with no open wound or drainage. Area tender to palpation. Mild edema to the  angle of the mandible on the right. No erythema to the posterior pharynx. Tonsils without edema or exudates. Uvula midline. No trismus. No difficulty controlling secretions. Tympanic membranes gray and translucent bilaterally with no erythema, edema, or hemotympanum.  No mastoid or tragal tenderness bilaterally.   Eyes: Conjunctivae are normal. Pupils are equal, round, and reactive to light. Right eye exhibits no discharge. Left eye exhibits no discharge.  Neck: Normal range of motion. Neck  supple.  No nuchal rigidity   Cardiovascular: Normal rate, regular rhythm and normal heart sounds.  Exam reveals no gallop and no friction rub.   No murmur heard. Pulmonary/Chest: Effort normal and breath sounds normal. No respiratory distress. He has no wheezes. He has no rales. He exhibits no tenderness.  Abdominal: Soft.  Musculoskeletal: Normal range of motion. He exhibits no edema and no tenderness.  Neurological: He is alert and oriented to person, place, and time.  Skin: Skin is warm and dry. He is not diaphoretic.    ED Course  INCISION AND DRAINAGE Date/Time: 12/02/2013 10:30 PM Performed by: Coral CeoPALMER, Marcy Sookdeo K Authorized by: Jillyn LedgerPALMER, Elsie Sakuma K Consent: Verbal consent obtained. Consent given by: patient Patient identity confirmed: verbally with patient Type: abscess Body area: head/neck Location details: face Anesthesia: local infiltration Local anesthetic: lidocaine 2% without epinephrine Anesthetic total: 4 ml Patient sedated: no Scalpel size: 11 Incision type: single straight Complexity: simple Drainage: purulent Drainage amount: copious Wound treatment: drain placed Packing material: 1/4 in iodoform gauze Patient tolerance: Patient tolerated the procedure well with no immediate complications.   (including critical care time) Labs Review Labs Reviewed - No data to display  Imaging Review No results found.   EKG Interpretation None       MDM   Cody Banks is a 33 y.o. male with no PMH who presents to the ED for evaluation of an abscess, which was drained in the ED. Patient has low grade fever 100.6 which reduced in the ED. Doubt septic infection. Patient non-toxic. Doubt deep cervical tissue infection. Abscess superficial. No meningeal signs. Wound care discussed with patient. Given first dose of antibiotics in the ED. Return for drain removal in 2 days and wound re-check. Return precautions, discharge instructions, and follow-up was discussed with the  patient before discharge.     Discharge Medication List as of 12/02/2013 10:35 PM    START taking these medications   Details  cephALEXin (KEFLEX) 500 MG capsule Take 1 capsule (500 mg total) by mouth 2 (two) times daily., Starting 12/02/2013, Until Discontinued, Print    sulfamethoxazole-trimethoprim (SEPTRA DS) 800-160 MG per tablet Take 1 tablet by mouth every 12 (twelve) hours., Starting 12/02/2013, Until Discontinued, Print        Final impressions: 1. Facial abscess   2. Fever      Luiz IronJessica Katlin Demeshia Sherburne PA-C   This patient was discussed with Dr. Azell DerJames        Cody Convey K Maytte Jacot, PA-C 12/03/13 [phone]

## 2013-12-02 NOTE — ED Notes (Signed)
Pt c/o abscess to right side of face x 3 days Pt request NO narcotics

## 2013-12-02 NOTE — Discharge Instructions (Signed)
Take antibiotics for full dose - even if your symptoms are improving  Take Tylenol for fever and pain  Warm compresses to the area several times a day - this will help with drainage  Return to the emergency department if you develop any changing/worsening condition, continued fever, increased drainage, spreading redness/swelling, neck stiffness, feeling worse, or any other concerns (please read additional information regarding your condition below)  Abscess An abscess is an infected area that contains a collection of pus and debris.It can occur in almost any part of the body. An abscess is also known as a furuncle or boil. CAUSES  An abscess occurs when tissue gets infected. This can occur from blockage of oil or sweat glands, infection of hair follicles, or a minor injury to the skin. As the body tries to fight the infection, pus collects in the area and creates pressure under the skin. This pressure causes pain. People with weakened immune systems have difficulty fighting infections and get certain abscesses more often.  SYMPTOMS Usually an abscess develops on the skin and becomes a painful mass that is red, warm, and tender. If the abscess forms under the skin, you may feel a moveable soft area under the skin. Some abscesses break open (rupture) on their own, but most will continue to get worse without care. The infection can spread deeper into the body and eventually into the bloodstream, causing you to feel ill.  DIAGNOSIS  Your caregiver will take your medical history and perform a physical exam. A sample of fluid may also be taken from the abscess to determine what is causing your infection. TREATMENT  Your caregiver may prescribe antibiotic medicines to fight the infection. However, taking antibiotics alone usually does not cure an abscess. Your caregiver may need to make a small cut (incision) in the abscess to drain the pus. In some cases, gauze is packed into the abscess to reduce pain and  to continue draining the area. HOME CARE INSTRUCTIONS   Only take over-the-counter or prescription medicines for pain, discomfort, or fever as directed by your caregiver.  If you were prescribed antibiotics, take them as directed. Finish them even if you start to feel better.  If gauze is used, follow your caregiver's directions for changing the gauze.  To avoid spreading the infection:  Keep your draining abscess covered with a bandage.  Wash your hands well.  Do not share personal care items, towels, or whirlpools with others.  Avoid skin contact with others.  Keep your skin and clothes clean around the abscess.  Keep all follow-up appointments as directed by your caregiver. SEEK MEDICAL CARE IF:   You have increased pain, swelling, redness, fluid drainage, or bleeding.  You have muscle aches, chills, or a general ill feeling.  You have a fever. MAKE SURE YOU:   Understand these instructions.  Will watch your condition.  Will get help right away if you are not doing well or get worse. Document Released: 03/30/2005 Document Revised: 12/20/2011 Document Reviewed: 09/02/2011 [facility] Patient Information 2014 [place], [place].   Emergency Department Resource Guide 1) Find a Doctor and Pay Out of Pocket Although you won't have to find out who is covered by your insurance plan, it is a good idea to ask around and get recommendations. You will then need to call the office and see if the doctor you have chosen will accept you as a new patient and what types of options they offer for patients who are self-pay. Some doctors offer discounts  or will set up payment plans for their patients who do not have insurance, but you will need to ask so you aren't surprised when you get to your appointment.  2) Contact Your Local Health Department Not all health departments have doctors that can see patients for sick visits, but many do, so it is worth a call to see if yours does. If you don't  know where your local health department is, you can check in your phone book. The CDC also has a tool to help you locate your state's health department, and many state websites also have listings of all of their local health departments.  3) Find a Walk-in Clinic If your illness is not likely to be very severe or complicated, you may want to try a walk in clinic. These are popping up all over the country in pharmacies, drugstores, and shopping centers. They're usually staffed by nurse practitioners or physician assistants that have been trained to treat common illnesses and complaints. They're usually fairly quick and inexpensive. However, if you have serious medical issues or chronic medical problems, these are probably not your best option.  No Primary Care Doctor: - Call Health Connect at  [phone] - they can help you locate a primary care doctor that  accepts your insurance, provides certain services, etc. - Physician Referral Service- [phone]  Chronic Pain Problems: Organization         Address  Phone   Notes  [facility]  [phone] Patients need to be referred by their primary care doctor.   Medication Assistance: Organization         Address  Phone   Notes  [facility] Medication [facility] [address] [place], [place] [address] [phone] --Must be a resident of [place] -- Must have NO insurance coverage whatsoever (no Medicaid/ Medicare, etc.) -- The pt. MUST have a primary care doctor that directs their care regularly and follows them in the community   MedAssist  [phone]   [employer]  [phone]) [phone]    Agencies that provide inexpensive medical care: Organization         Address  Phone   Notes  [facility]  [phone]   [facility]    [phone]   [facility] [address] [place], [place] [address] [phone]   [facility]  [place] [address], [place] [phone]   [facility]anned Parenthood    [phone]   [facility]    [phone]) [phone]   Community Health and [facility]  [address], [place] Phone:  [phone], Fax:  [phone] Hours of Operation:  9 am - 6 pm, M-F.  Also accepts Medicaid/Medicare and self-pay.  [facility]  [address], [place] Phone: [phone], Fax: [phone]. Hours of Operation:  8:30 am - 5:30 pm, M-F.  Also accepts Medicaid and self-pay.  [facility] [place] [address], [place] Phone: [phone]   [facility] [address], [place], [place] [phone], Ext. [phone] Mondays & Thursdays: 7-9 AM.  First 15 patients are seen on a first come, first serve basis.    Medicaid-accepting [facility] Providers:  Organization         Address  Phone   Notes  [facility] [address], [address], [place] (  [phone] Also accepts self-pay patients.  [facility] [address] Laurell Josephs [place], [place]  [phone]   [facility] [address], [place] [phone]   [facility] [address] [place] [phone]   Renaye Rakers [address], [place]   [phone] Only accepts [place] Access [place] patients after they have their name applied to their card.   Self-Pay (no insurance) in [facility]:  Organization         Address  Phone   Notes  Sickle Cell Patients, [facility] [address] [place], [place] [phone]   [facility] [address] [place], [place] [phone]   [facility]  [address], [place] [phone]   [facility]/Dr. Osei-Bonsu  [address] [place] or [address], [place] [phone] Phone  number for both [place] and [place] locations is the same.  Urgent Medical and [facility] [address], [place] [phone]   [facility] [address] [place] or [address] [phone]   [facility] [address] [place] [phone], phone; [phone], fax Sees patients 1st and 3rd Saturday of every month.  Must not qualify for public or private insurance (i.e. Medicaid, Medicare, [place] Health Choice, Veterans' Benefits)  Household income should be no more than 200% of the poverty level The clinic cannot treat you if you are pregnant or think you are pregnant  Sexually transmitted diseases are not treated at the clinic.    Dental Care: Organization         Address  Phone  Notes  [facility] Department of [facility] [address] [place], [place] [phone] Accepts children up to age 15 who are enrolled in [place] or [place] Health Choice; pregnant women with a Medicaid card; and children who have applied for Medicaid or [place] Health Choice, but were declined, whose parents can pay a reduced fee at time of service.  [facility] of [facility]  [address], [place] [phone] Accepts children up to age 92 who are enrolled in [place] or [place] Health Choice; pregnant women with a Medicaid card; and children who have applied for Medicaid or [place] Health Choice, but were declined, whose parents can pay a reduced fee at time of service.  [facility]  [address] [place], [place] [phone] Patients are seen by appointment only. Walk-ins are not accepted. [facility] will see patients 67 years of age and older. Monday - Tuesday (8am-5pm) Most Wednesdays (8:30-5pm) $30 per visit, cash only  [facility] Adult [facility] Access PROGRAM  [address], [facility] [phone] Patients are seen by appointment only.  Walk-ins are not accepted. [facility] will see patients 58 years of age and older. One Wednesday Evening (Monthly: Volunteer Based).  $30 per visit, cash only  [employer]  [phone] for adults; Children under age 62, call Graduate Pediatric Dentistry at [phone]. Children aged 6-14, please call [phone] to request a pediatric application.  Dental services are provided in all areas of dental care including fillings, crowns and bridges, complete and partial dentures, implants, gum treatment, root canals, and extractions. Preventive care is also  provided. Treatment is provided to both adults and children. Patients are selected via a lottery and there is often a waiting list.   [facility] [address] [place]  [phone] [email or web address]   [facility] [address] [place], [place] [phone], Ext. [phone] Second and Fourth Thursday of each month, opens at 6:30 AM; Clinic ends at 9 AM.  Patients are seen on a first-come first-served basis, and a limited number are seen during each clinic.   [facility]  [address] Ether Griffins [place], [place] [phone]   Eligibility Requirements You must have lived in [place], [place], or [place] counties for at least the last three months.   You cannot be eligible for state or federal sponsored [place], including [employer], [place], or [employer].   You generally cannot be eligible for healthcare insurance through your employer.    How to apply: Eligibility screenings are held every Tuesday and Wednesday afternoon from 1:00 pm until 4:00 pm. You do not need an appointment for the interview!  [facility] [address], [place], [place] [phone]   [facility] Health Department  [phone]   [facility] Health Department  [phone]   [facility] Health Department  [phone]    Behavioral Health  Resources in the Community: Intensive Outpatient Programs Organization         Address  Phone  Notes  [facility] [address] [place], [place] [phone]   [facility] Out[facility] [address] [place], [place] [phone]   [facility]: [facility] [address] [place], [place]  [phone]   [facility] [address]  [place], [place] [phone] or [phone]   Substance Abuse Resources Organization         Address  Phone  Notes  Alcohol [facility] Drug Services  [phone]   [facility]  [phone]   [facility]  [phone]   Floydene Flock  [phone]   Residential & Outpatient Substance Abuse Program  [phone]   Psychological Services Organization         Address  Phone  Notes  [facility]  [phone]   [facility] Services  [phone]   [facility] : [facility] Mental Health [address] [place] [phone] or [phone]    Mobile Crisis Teams Organization         Address  Phone  Notes  [facility], Mobile Crisis Care Unit  [phone]   [facility]  [address] [place], [place] [phone]   Doristine Locks [address] [place] [phone]    Self-Help/Support Groups Organization         Address  Phone             Notes  [facility] of [place] - variety of support groups  336- I7437963 Call for more information  Narcotics Anonymous (NA), [facility] [address], [employer] [place]  2 meetings at this location   Statistician         Address  Phone  Notes  [facility] [address],    [place]  [phone]   [facility]  [address], [place] [address], [place], [place] [phone]   [facility] [address] [place], [place] [phone] Admissions: 8am-3pm M-F  [facility]cent[facility] Substance Abuse  Treatment Center [address]  [place], [place]   [facility] [address] Jadene Pierini [place], [place]   The [place].,  [place], [place]   [facility] - Intensive Outpatient [address], [place], [place]   [facility] [address]  [place], [place] [phone] or [phone]   [facility] ([facility]) [address] [place], [place] Accepts Medicaid  [facility] [place] [address],  [place] [phone] Substance Abuse/Addiction Treatment   [facility] Organization         Address  Phone  Notes  [facility]  [phone]   Domenic Schwab, PhD [address] Arlis Porta [place], [place]   [phone] or [phone]   [facility] [place], [place] [phone]   [facility] [address], [place], [place] [phone] Insurance/Medicaid/sponsorship through [facility] and Families [address] [place] [address]                                    [place], [place] [phone] [place] [address], [place] [phone]    Dr. Adele Schilder  [phone]   [facility] Dept. 1) [address], [place] 2) [place] 3)  [place] [address], [place] [phone]  [phone]   [place] [phone] or [phone] (After Hours)

## 2013-12-17 NOTE — ED Provider Notes (Signed)
Medical screening examination/treatment/procedure(s) were conducted as a shared visit with non-physician practitioner(s) and myself.  I personally evaluated the patient during the encounter.   EKG Interpretation None      Pt seen and evaluated face to face.  Spontaneous swelling to lt lateral face.  Exam shows fluctuance c/w abscess.  Pt re-examinedafter I&D.  Improved.  Reviewed plan with patient.  Rolland PorterMark Joeangel Jeanpaul, MD 12/17/13 2142

## 2015-01-13 ENCOUNTER — Observation Stay (HOSPITAL_COMMUNITY)
Admission: RE | Admit: 2015-01-13 | Discharge: 2015-01-14 | Disposition: A | Discharge status: Home / Self Care | Payer: 59 | Attending: Family | Admitting: Family

## 2015-01-13 ENCOUNTER — Encounter (HOSPITAL_COMMUNITY): Payer: Self-pay | Admitting: *Deleted

## 2015-01-13 DIAGNOSIS — G8929 Other chronic pain: Secondary | ICD-10-CM | POA: Insufficient documentation

## 2015-01-13 DIAGNOSIS — F1994 Other psychoactive substance use, unspecified with psychoactive substance-induced mood disorder: Secondary | ICD-10-CM | POA: Diagnosis present

## 2015-01-13 DIAGNOSIS — F419 Anxiety disorder, unspecified: Secondary | ICD-10-CM | POA: Insufficient documentation

## 2015-01-13 DIAGNOSIS — F1210 Cannabis abuse, uncomplicated: Secondary | ICD-10-CM | POA: Insufficient documentation

## 2015-01-13 DIAGNOSIS — G4700 Insomnia, unspecified: Secondary | ICD-10-CM | POA: Insufficient documentation

## 2015-01-13 DIAGNOSIS — F332 Major depressive disorder, recurrent severe without psychotic features: Principal | ICD-10-CM | POA: Diagnosis present

## 2015-01-13 DIAGNOSIS — F1910 Other psychoactive substance abuse, uncomplicated: Secondary | ICD-10-CM | POA: Diagnosis present

## 2015-01-13 DIAGNOSIS — Z87891 Personal history of nicotine dependence: Secondary | ICD-10-CM | POA: Insufficient documentation

## 2015-01-13 DIAGNOSIS — M5432 Sciatica, left side: Secondary | ICD-10-CM | POA: Diagnosis not present

## 2015-01-13 LAB — RAPID URINE DRUG SCREEN, HOSP PERFORMED
AMPHETAMINES: NOT DETECTED
Barbiturates: NOT DETECTED
Benzodiazepines: NOT DETECTED
Cocaine: NOT DETECTED
Opiates: NOT DETECTED
TETRAHYDROCANNABINOL: POSITIVE — AB

## 2015-01-13 LAB — CBC WITH DIFFERENTIAL/PLATELET
BASOS PCT: 0 % (ref 0–1)
Basophils Absolute: 0 10*3/uL (ref 0.0–0.1)
EOS ABS: 0.1 10*3/uL (ref 0.0–0.7)
EOS PCT: 1 % (ref 0–5)
HCT: 42.6 % (ref 39.0–52.0)
Hemoglobin: 14.4 g/dL (ref 13.0–17.0)
LYMPHS ABS: 3.6 10*3/uL (ref 0.7–4.0)
Lymphocytes Relative: 36 % (ref 12–46)
MCH: 30.7 pg (ref 26.0–34.0)
MCHC: 33.8 g/dL (ref 30.0–36.0)
MCV: 90.8 fL (ref 78.0–100.0)
Monocytes Absolute: 0.9 10*3/uL (ref 0.1–1.0)
Monocytes Relative: 9 % (ref 3–12)
NEUTROS ABS: 5.4 10*3/uL (ref 1.7–7.7)
NEUTROS PCT: 54 % (ref 43–77)
Platelets: 210 10*3/uL (ref 150–400)
RBC: 4.69 MIL/uL (ref 4.22–5.81)
RDW: 12.4 % (ref 11.5–15.5)
WBC: 10 10*3/uL (ref 4.0–10.5)

## 2015-01-13 LAB — COMPREHENSIVE METABOLIC PANEL
ALK PHOS: 50 U/L (ref 38–126)
ALT: 18 U/L (ref 17–63)
AST: 15 U/L (ref 15–41)
Albumin: 4.3 g/dL (ref 3.5–5.0)
Anion gap: 8 (ref 5–15)
BILIRUBIN TOTAL: 0.8 mg/dL (ref 0.3–1.2)
BUN: 17 mg/dL (ref 6–20)
CALCIUM: 9.5 mg/dL (ref 8.9–10.3)
CO2: 29 mmol/L (ref 22–32)
Chloride: 106 mmol/L (ref 101–111)
Creatinine, Ser: 0.96 mg/dL (ref 0.61–1.24)
GFR calc Af Amer: >60 mL/min (ref >60)
GLUCOSE: 84 mg/dL (ref 65–99)
POTASSIUM: 3.7 mmol/L (ref 3.5–5.1)
SODIUM: 143 mmol/L (ref 135–145)
TOTAL PROTEIN: 7.5 g/dL (ref 6.5–8.1)

## 2015-01-13 LAB — URINALYSIS, ROUTINE W REFLEX MICROSCOPIC
Bilirubin Urine: NEGATIVE
Glucose, UA: NEGATIVE mg/dL
Hgb urine dipstick: NEGATIVE
Ketones, ur: NEGATIVE mg/dL
Leukocytes, UA: NEGATIVE
Nitrite: NEGATIVE
PH: 5 (ref 5.0–8.0)
PROTEIN: NEGATIVE mg/dL
Specific Gravity, Urine: 1.02 (ref 1.005–1.030)
Urobilinogen, UA: 0.2 mg/dL (ref 0.0–1.0)

## 2015-01-13 LAB — GLUCOSE, CAPILLARY: Glucose-Capillary: 100 mg/dL — ABNORMAL HIGH (ref 65–99)

## 2015-01-13 LAB — TSH: TSH: 1.266 u[IU]/mL (ref 0.350–4.500)

## 2015-01-13 MED ORDER — ALUM & MAG HYDROXIDE-SIMETH 200-200-20 MG/5ML PO SUSP: 30.0000 mL | ORAL | Status: DC | PRN

## 2015-01-13 MED ORDER — HYDROXYZINE HCL 25 MG PO TABS
25.0000 mg | ORAL_TABLET | Freq: Four times a day (QID) | ORAL | Status: DC | PRN
  Administered 2015-01-13: 25 mg via ORAL
  Filled 2015-01-13: qty 1

## 2015-01-13 MED ORDER — MAGNESIUM HYDROXIDE 400 MG/5ML PO SUSP: 30.0000 mL | Freq: Every day | ORAL | Status: DC | PRN

## 2015-01-13 MED ORDER — FLUOXETINE HCL 20 MG PO CAPS: 20.0000 mg | ORAL_CAPSULE | Freq: Every day | ORAL | Status: DC

## 2015-01-13 MED ORDER — ESCITALOPRAM OXALATE 10 MG PO TABS
10.0000 mg | ORAL_TABLET | Freq: Every day | ORAL | Status: DC
  Administered 2015-01-13 – 2015-01-14 (×2): 10 mg via ORAL
  Filled 2015-01-13 (×2): qty 1

## 2015-01-13 MED ORDER — GABAPENTIN 100 MG PO CAPS
100.0000 mg | ORAL_CAPSULE | Freq: Three times a day (TID) | ORAL | Status: DC
  Administered 2015-01-13 – 2015-01-14 (×3): 100 mg via ORAL
  Filled 2015-01-13 (×3): qty 1

## 2015-01-13 MED ORDER — TRAZODONE HCL 50 MG PO TABS: 50.0000 mg | ORAL_TABLET | Freq: Every evening | ORAL | Status: DC | PRN

## 2015-01-13 MED ORDER — TRAZODONE HCL 50 MG PO TABS
50.0000 mg | ORAL_TABLET | Freq: Every evening | ORAL | Status: DC | PRN
  Administered 2015-01-13 (×2): 50 mg via ORAL
  Filled 2015-01-13 (×2): qty 1

## 2015-01-13 MED ORDER — ACETAMINOPHEN 325 MG PO TABS: 650.0000 mg | ORAL_TABLET | Freq: Four times a day (QID) | ORAL | Status: DC | PRN

## 2015-01-13 MED ORDER — MELOXICAM 7.5 MG PO TABS
7.5000 mg | ORAL_TABLET | Freq: Every day | ORAL | Status: DC
  Administered 2015-01-13 – 2015-01-14 (×2): 7.5 mg via ORAL
  Filled 2015-01-13 (×2): qty 1

## 2015-01-13 NOTE — Progress Notes (Signed)
D: Pt is a 34 y/o caucasian [facility]. Pt presents with flat affect and anxious mood. Pt was restless, sitting on edge of bed, shaking his legs, unable to be still at the time. Pt denied SI, HI, AVH and pain at the time. Per chart and as confirmed by pt--he came in requesting "help" and asking for "someone to talk to". Pt stated he stole some Neurontin from his boss's father in law and was confronted about it this AM so he decided to seek help. Pt reported he's been depressed for some time, listed his stressors-- recent lost of his father X1 year, states he works approximately 15 hours/day X 7 days to cope with his feelings "the bills got to be paid, I have a daughter who's taking dance classes". Pt reports insomnia, states he smokes THC 3-4X / week to help him sleep.  A: 1:1 contact made with pt to conduct assessment. Noninvasive search and skin assessment done as per protocol ([place]--bilateral arms & hands, back and chest) and no contraband found. Unit orientation done and routines discussed. Urine Sample obtained as per order. All medications including PRN Vistaril (anxiety) administered as per MD's orders. Availability and emotional support offered. Lunch and fluids given to pt on arrival to observation unit, however, pt did not eat, stated "poor appetite". Pt encouraged to comply with treatment regimen. Safety maintained on Q 15 minutes checks as ordered. R: Pt receptive to care and compliant with current treatment regimen. Pt verbalized understanding of unit routines. Pt  Remains safe on unit. Will continue to monitor for safety and stabilization.

## 2015-01-13 NOTE — Plan of Care (Signed)
[facility] Observation Crisis Plan  Reason for Crisis Plan:  Crisis Stabilization and Medication Management   Plan of Care:  Referral for Telepsychiatry/Psychiatric Consult  Family Support:    Yes (wife and children)  Current Living Environment:  Living Arrangements: Spouse/significant other, Children  Insurance:   Hospital Account    Name Acct ID Class Status Primary Coverage   Cody Banks, Cody Banks [id number] BEHAVIORAL HEALTH OBSERVATION Open UNITED HEALTHCARE - [place] BEHAVIORAL HEALTH        Guarantor Account (for Hospital Account [id number])    Name Relation to Pt Service Area Active? Acct Type   Cody Banks, Cody Banks [facility] Yes Behavioral Health   Address Phone       [address] [place], [place] [address] [phone](H)          Coverage Information (for Hospital Account [id number])    F/O Payor/Plan Precert #   [facility]    Subscriber Subscriber #   Cody Banks, Cody [id number]   Address Phone   [address] [place], [place] [phone]      Legal Guardian:     Primary Care Provider:  No PCP Per Patient  Current Outpatient Providers:  Outpatient treatment   Psychiatrist:  Name of Psychiatrist:  (none)  Counselor/Therapist:  Name of Therapist:  (none)  Compliant with Medications:  Yes  Additional Information:   Cody Banks, Cody Banks 01/13/2015 1332

## 2015-01-13 NOTE — B[facility] Counselor (Signed)
[facility] Assessment Progress Note  Pt presents as very anxious as evidenced by his continuous refusal to try to lie back and relax or eat and his direct statements ("I have so many thoughts running thru my head", "I feel like I should be doing something [right now]", "I don't think I can eat anything b/c I'm too [wound up]"). Pt also presents as pleasant and open about sharing his struggles.   Pt shares that he lives with his wife and 2 school-age children. 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 Pt shares that constantly working is normal for him and it is difficult to relax b/c he's used to always moving. Pt hints at being overwhelmed due to financial stressors, but expresses that he hasn't been taken his medication as prescribed due to it being hard to get his meds. He shares that [place] in [place], [place], but [place].   After much discussion, it was agreed that pt would benefit from receiving mental health services (psychiatry, possibly therapy) closer to his home. Counselor will assist pt in finding such resources.   Cody ShockSamantha M. Ladona Ridgelaylor, MS, NCC Disposition Counselor

## 2015-01-13 NOTE — B[facility] Counselor (Signed)
[facility] Assessment Progress Note  Counselor t/c to [facility] @ 1500 and left a message with Marquita PalmsMario, requesting a call back, inquiring about making a referral for OP services. Have not yet rec'd a return call.   Johny ShockSamantha M. Ladona Ridgelaylor, MS, NCC Disposition Counselor

## 2015-01-13 NOTE — BH Assessment (Signed)
Assessment Note  Cody PaciniMichael Banks is an 34 y.o. male who presents as a walk in to [facility] requesting "help" and "someone to talk to".  He recently stole some neurotin from his boss's father in law and when confronted this morning agreed that he needed to seek help.    Patient discussed a history of depression and substance abuse.  He discussed current feelings of sadness, hopelessness and guilt.  He stated that he has difficulty sleeping, only getting 2-5 hours per night, has no appetite and stated that he lost 75 pounds over the past year.  He also discussed feelings of anxiety and panic attacks.    Patient discussed a history of opiate abuse with 4 different inpatient [facility], the last one over a year ago.  He stated that he has not used opiates in over a year but admits that he smokes 3 joints of marijuana daily and takes 7 - 8 300 mg of neurotin when he has the drug.    Patient denied suicidal or homicidal history or current ideations.  He stated that when he has strong feelings of sadness he "lights up" his marijuana.  He is currently experiencing feelings of sadness over his father's death which occurred a year ago from cancer.    Consulted with Claudette Headonrad Withrow N.P who [facility].  [facility] and informed the observation unit.  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 No pertinent past medical history. Axis IV: economic problems, educational problems, problems related to legal system/crime, problems related to social environment and problems with primary support group Axis V: 21-30 behavior considerably influenced by delusions or hallucinations OR serious impairment in judgment, communication OR inability to function in almost all areas  Past Medical History: No past medical history on file.  No past surgical history on  file.  Family History: No family history on file.  Social History:  reports that he has quit smoking. He does not have any smokeless tobacco history on file. He reports that he does not drink alcohol or use illicit drugs.  Additional Social History:  Alcohol / Drug Use Pain Medications:  (neurotin) History of alcohol / drug use?: Yes (marijuna, opiates, neurotin) Longest period of sobriety (when/how long):  (currently using 3 joints of marijuana daily /no opiates in last year, taking  7 - 8 of 300 mg neurotin) Negative Consequences of Use: Work / Programmer, multimediachool (stole neurotin from his boss's father in law/ lost trust) Withdrawal Symptoms:  (none reported) Substance #1 1 - Age of First Use:  (unknown)  CIWA:   COWS:    Allergies: No Known Allergies  Home Medications:  Medications Prior to Admission  Medication Sig Dispense Refill  . cephALEXin (KEFLEX) 500 MG capsule Take 1 capsule (500 mg total) by mouth 2 (two) times daily. 20 capsule 0  . FLUoxetine (PROZAC) 40 MG capsule Take 40 mg by mouth daily.    . meloxicam (MOBIC) 7.5 MG tablet Take 7.5 mg by mouth daily.    Marland Kitchen. sulfamethoxazole-trimethoprim (SEPTRA DS) 800-160 MG per tablet Take 1 tablet by mouth every 12 (twelve) hours. 20 tablet 0    OB/GYN Status:  No LMP for male patient.  General Assessment Data Location of Assessment: [facility] Assessment Services Is this a Tele or Face-to-Face Assessment?: Face-to-Face Is this an Initial Assessment or a Re-assessment for this encounter?: Initial Assessment Marital status:  Married [place] name:  (NA) Is patient pregnant?:  (N/A) Living Arrangements: Spouse/significant other, Children Can pt return to current living arrangement?: Yes Admission Status:  (Observation) Is patient capable of signing voluntary admission?: Yes Insurance type:  Advertising copywriter)  Medical Screening Exam [facility] Walk-in ONLY) Medical Exam completed: No  Crisis Care Plan Living Arrangements: Spouse/significant  other, Children Name of Psychiatrist:  (none) Name of Therapist:  (none)  Education Status Is patient currently in school?: No Highest grade of school patient has completed:  (10th)  Risk to self with the past 6 months Suicidal Ideation: No Suicidal Intent: No Has patient had any suicidal intent within the past 6 months prior to admission? : No Suicidal Plan?: No What has been your use of drugs/alcohol within the last 12 months?:  (marijuna 3 joints daily, neurotin 300mg  7 - 8 pills daily) Previous Attempts/Gestures: No Other Self Harm Risks:  (none) Intentional Self Injurious Behavior: None Family Suicide History: No Recent stressful life event(s): Loss (Comment) (father passed away last year he was estranged from father) Persecutory voices/beliefs?: No Depression Symptoms: Insomnia, Tearfulness, Guilt, Feeling worthless/self pity  Risk to Others within the past 6 months Homicidal Ideation: No Thoughts of Harm to Others: No Current Homicidal Plan: No Identified Victim:  (N/A) History of harm to others?: No Criminal Charges Pending?: No Does patient have a court date: No Is patient on probation?: Yes  Psychosis Hallucinations: None noted Delusions: None noted  Mental Status Report Appearance/Hygiene:  (Well groomed) Eye Contact: Fair Speech: Logical/coherent Level of Consciousness: Alert Mood: Depressed Affect: Depressed Anxiety Level: Minimal Thought Processes: Relevant Judgement: Impaired Orientation: Place, Time, Situation  Cognitive Functioning Concentration: Decreased Memory: Recent Impaired IQ: Average Insight: Poor Appetite: Fair Weight Loss:  (lost 75 pounds in last year) Weight Gain:  (no) Sleep: Decreased Total Hours of Sleep:  (2-5)  ADLScreening [facility] Assessment Services) Patient's cognitive ability adequate to safely complete daily activities?: Yes Patient able to express need for assistance with ADLs?: No Independently performs ADLs?: Yes  (appropriate for developmental age)  Prior Inpatient Therapy Prior Inpatient Therapy:  (wife) Prior Therapy Dates:  (none) Prior Therapy Facilty/Provider(s):  (none)  Prior Outpatient Therapy Prior Outpatient Therapy: No Prior Therapy Facilty/Provider(s):  ([facility]) Reason for Treatment:  (Opiate withdrawal and treatment) Does patient have an ACCT team?: No Does patient have Intensive In-House Services?  : No Does patient have [facility] services? : No Does patient have P4CC services?: No  ADL Screening (condition at time of admission) Patient's cognitive ability adequate to safely complete daily activities?: Yes Is the patient deaf or have difficulty hearing?: No Does the patient have difficulty seeing, even when wearing glasses/contacts?: No Does the patient have difficulty concentrating, remembering, or making decisions?: No Patient able to express need for assistance with ADLs?: No Does the patient have difficulty dressing or bathing?: No Independently performs ADLs?: Yes (appropriate for developmental age) Does the patient have difficulty walking or climbing stairs?: No Weakness of Legs: None Weakness of Arms/Hands: None  Home Assistive Devices/Equipment Home Assistive Devices/Equipment: None  Therapy Consults (therapy consults require a physician order) PT Evaluation Needed: No OT Evalulation Needed: No SLP Evaluation Needed: No Abuse/Neglect Assessment (Assessment to be complete while patient is alone) Physical Abuse: Denies Verbal Abuse: Denies Sexual Abuse: Denies Exploitation of patient/patient's resources: Denies Self-Neglect: Denies     Merchant navy officer (For Healthcare) Does patient have an advance directive?: No Would patient like information on creating an advanced directive?: No - patient declined information  Additional Information 1:1 In Past 12 Months?: No CIRT Risk: No Elopement Risk: No Does patient have medical clearance?: Yes      Disposition:  Disposition Initial Assessment Completed for this Encounter: Yes Disposition of Patient:  (Observation unit)  On Site Evaluation by:   Reviewed with Physician:    Annetta Maw 01/13/2015 1:08 PM

## 2015-01-13 NOTE — Progress Notes (Signed)
[facility] INPATIENT:  Family/Significant Other Suicide Prevention Education  Suicide Prevention Education:  Patient Refusal for Family/Significant Other Suicide Prevention Education: The patient Cody PaciniMichael Wiedeman has refused to provide written consent for family/significant other to be provided Family/Significant Other Suicide Prevention Education during admission and/or prior to discharge.  Physician notified.  [name], Avagail Whittlesey 01/13/2015, 1335

## 2015-01-13 NOTE — Discharge Instructions (Signed)
To help manage your mental health issues,  It is recommended that you seek psychiatric and therapy services. Below are a list of agencies in your area and their contact information:  [facility] [address] [place], [place] [id number] [phone]

## 2015-01-14 DIAGNOSIS — F332 Major depressive disorder, recurrent severe without psychotic features: Secondary | ICD-10-CM | POA: Diagnosis not present

## 2015-01-14 LAB — T4, FREE: FREE T4: 0.82 ng/dL (ref 0.61–1.12)

## 2015-01-14 MED ORDER — HYDROXYZINE HCL 25 MG PO TABS: 25.0000 mg | ORAL_TABLET | Freq: Four times a day (QID) | ORAL | Status: AC | PRN

## 2015-01-14 MED ORDER — GABAPENTIN 100 MG PO CAPS: 100.0000 mg | ORAL_CAPSULE | Freq: Three times a day (TID) | ORAL | Status: AC

## 2015-01-14 MED ORDER — TRAZODONE HCL 100 MG PO TABS: 100.0000 mg | ORAL_TABLET | Freq: Every evening | ORAL | Status: AC | PRN

## 2015-01-14 MED ORDER — ESCITALOPRAM OXALATE 10 MG PO TABS: 10.0000 mg | ORAL_TABLET | Freq: Every day | ORAL | Status: AC

## 2015-01-14 NOTE — Progress Notes (Signed)
D: Patient denies SI/HI and auditory and visual hallucinations.Patient openly discussing his past, treatment, addiction, work and recovery. Patient very talkative, states he is feeling much better today. Discussed his desire to have therapy as well as meds. Patient states he is ready for discharge.  A: Patient given emotional support from RN. Patient given medications per MD orders.   R: Patient remains cooperative and appropriate. Will continue to monitor patient for safety.

## 2015-01-14 NOTE — [facility] Counselor (Addendum)
[facility] Assessment Progress Note  Counselor observes pt to be much calmer today and speaking insightfully and hopefully concerning his life. He was open to the OP resources counselor discussed with him and is ready for d/c.   Johny ShockSamantha M. Ladona Ridgelaylor, MS, NCC Disposition Counselor

## 2015-01-14 NOTE — Progress Notes (Signed)
Patient ID: Cody PaciniMichael Nadeem, male   DOB: 05/06/1981, 34 y.o.   MRN: [id number] Patient discharged per MD orders. Patient given education regarding follow-up appointments and medications. Patient denies any questions or concerns about these instructions. Patient was escorted to locker and given belongings before discharge to hospital lobby. Patient currently denies SI/HI and auditory and visual hallucinations on discharge.

## 2015-01-14 NOTE — H&P (Signed)
[facility] OBS UNIT H&P  Patient Identification: Cody Banks MRN: [id number] Principal Diagnosis: MDD (major depressive disorder), recurrent severe, without psychosis Diagnosis:  Patient Active Problem List   Diagnosis Date Noted  . MDD (major depressive disorder), recurrent severe, without psychosis [F33.2] 01/13/2015    Priority: High  . Polysubstance abuse [F19.10] 01/13/2015    Priority: High  . Substance induced mood disorder [F19.94] 01/13/2015    Priority: High    Total Time spent on patient: 45 minutes  Subjective:  Cody Banks is a 33 y.o. male patient admitted with reports of major depression, anxiety, and chronic pain. Pt reports feeling overwhelmed with these stressors and feels unstable. Pt has been taking a friend's neurontin in addition to his own prescribed neurontin for his chronic sciatic pain (left side). Pt's description of symptoms is consistent with sciatic nerve pain that travels down to his foot. Pt has also been taking Zoloft 50mg  daily that does not belong to him although he has a history of success with prozac. Discussed the activating features of prozac with pt and he does affirm severe intermittent anxiety, which would may Lexapro more appropriate. Pt denies suicidal/homicidal ideation and psychosis and does not appear to be responding to internal stimuli.   HPI: I have reviewed HPI below and modified as follows: Cody Banks is an 34 y.o. male who presents as a walk in to [facility] requesting "help" and "someone to talk to".Pt evaluated by [facility] TTS counselor. Patient discussed a history of depression and substance abuse. He discussed feelings of sadness, hopelessness guilft stated that he is currently having symptoms of sadness, crying, insomnia (2-5 hours per night), weight loss (75 pounds the last year), no appetite, and feelings of hopelessness.  Pt admitted to the [facility] OBS Unit for safety, stabilization, and  medication titration.   Past Medical History: History reviewed. No pertinent past medical history. History reviewed. No pertinent past surgical history. Family History: History reviewed. No pertinent family history. Social History:  History  Alcohol Use No    History  Drug Use No    History   Social History  . Marital Status: Unknown    Spouse Name: N/A  . Number of Children: N/A  . Years of Education: N/A   Social History Main Topics  . Smoking status: Former Games developer  . Smokeless tobacco: Not on file  . Alcohol Use: No  . Drug Use: No  . Sexual Activity: Not on file   Other Topics Concern  . None   Social History Narrative   Additional Social History:   Pain Medications: (neurotin) History of alcohol / drug use?: Yes (marijuna, opiates, neurotin) Longest period of sobriety (when/how long): (currently using 3 joints of marijuana daily /no opiates in last year, taking 7 - 8 of 300 mg neurotin) Negative Consequences of Use: Work / Programmer, multimedia (stole neurotin from his boss's father in law/ lost trust) Withdrawal Symptoms: (none reported) 1 - Age of First Use: (unknown)                   Allergies: No Known Allergies  Labs:    Lab Results Last 48 Hours    No results found for this or any previous visit (from the past 48 hour(s)).    Vitals: There were no vitals taken for this visit.  Risk to Self: Suicidal Ideation: No Suicidal Intent: No Suicidal Plan?: No What has been your use of drugs/alcohol within the last 12 months?: (marijuna 3 joints daily, neurotin 300mg   7 - 8 pills daily) Other Self Harm Risks: (none) Intentional Self Injurious Behavior: None Risk to Others: Homicidal Ideation: No Thoughts of Harm to Others: No Current Homicidal Plan: No Identified Victim: (N/A) History of harm to others?: No Criminal Charges Pending?: No Does patient have a court date: No Prior Inpatient Therapy:  Prior Inpatient Therapy: (wife) Prior Therapy Dates: (none) Prior Therapy Facilty/Provider(s): (none) Prior Outpatient Therapy: Prior Outpatient Therapy: No Prior Therapy Facilty/Provider(s): ([facility]) Reason for Treatment: (Opiate withdrawal and treatment) Does patient have an ACCT team?: No Does patient have Intensive In-House Services? : No Does patient have [facility] services? : No Does patient have P4CC services?: No  Current Facility-Administered Medications  Medication Dose Route Frequency Provider Last Rate Last Dose  . acetaminophen (TYLENOL) tablet 650 mg 650 mg Oral Q6H PRN Beau FannyJohn C Withrow, FNP    . alum & mag hydroxide-simeth (MAALOX/MYLANTA) 200-200-20 MG/5ML suspension 30 mL 30 mL Oral Q4H PRN Beau FannyJohn C Withrow, FNP    . FLUoxetine (PROZAC) capsule 20 mg 20 mg Oral Daily Beau FannyJohn C Withrow, FNP    . hydrOXYzine (ATARAX/VISTARIL) tablet 25 mg 25 mg Oral Q6H PRN Beau FannyJohn C Withrow, FNP    . magnesium hydroxide (MILK OF MAGNESIA) suspension 30 mL 30 mL Oral Daily PRN Beau FannyJohn C Withrow, FNP    . meloxicam (MOBIC) tablet 7.5 mg 7.5 mg Oral Daily Everardo AllJohn C Withrow, FNP    . traZODone (DESYREL) tablet 50 mg 50 mg Oral QHS PRN,MR X 1 Beau FannyJohn C Withrow, FNP      Musculoskeletal: Strength & Muscle Tone: within normal limits Gait & Station: normal Patient leans: N/A  Psychiatric Specialty Exam: Physical Exam  Review of Systems  Musculoskeletal: Positive for back pain (left with sciatic type radiation down through foot).  Psychiatric/Behavioral: Positive for depression. Negative for suicidal ideas, hallucinations and substance abuse. The patient is nervous/anxious and has insomnia.  All other systems reviewed and are negative.   There were no vitals taken for this visit.There is no weight on file to calculate BMI.  General Appearance: Casual and Fairly Groomed  Patent attorneyye Contact:: Good  Speech: Clear and  Coherent and Normal Rate  Volume: Normal  Mood: Anxious and Depressed  Affect: Appropriate, Congruent and Depressed  Thought Process: Coherent and Goal Directed  Orientation: Full (Time, Place, and Person)  Thought Content: WDL  Suicidal Thoughts: No  Homicidal Thoughts: No  Memory: Immediate; Fair Recent; Fair Remote; Fair  Judgement: Fair  Insight: Good  Psychomotor Activity: Normal  Concentration: Good  Recall: Good  Fund of Knowledge:Good  Language: Good  Akathisia: No  Handed:   AIMS (if indicated):    Assets: Communication Skills Desire for Improvement Housing Physical Health Resilience Social Support  ADL's: Intact  Cognition: WNL  Sleep:     Medical Decision Making: New problem, with additional work up planned, Review of Psycho-Social Stressors (1), Review or order clinical lab tests (1), Review and summation of old records (2), Review of Medication Regimen & Side Effects (2) and Review of New Medication or Change in Dosage (2)  Treatment Plan Summary: MDD (major depressive disorder), recurrent severe, without psychosis, exacerbation, will treat as below:  Daily contact with patient to assess and evaluate symptoms and progress in treatment and Medication management  Medications: -Vistaril 25mg  q6h prn anxiety -Lexapro 10mg  daily for anxiety/depression -Neurontin 100mg  tid (was on random low and high doses) -Trazodone 50mg  qhs prn and repeat x1 prn insomnia -Mobic 7.5mg  daily for chronic musculoskeletal pain  Labs: -CBC w/diff,  CMP, TSH, T4 free, UDS, UA w/microscopy, EKG 12-lead, CBG x1 (PM draw, will view in AM)  Disposition:  -Hold overnight in the [facility] OBS Unit to determine disposition for probable discharge, medication titration, and outpatient referrals to psychiatry and counseling.   Beau Fanny, FNP-BC 01/13/2015 1:27 PM     Case discussed with me as above

## 2015-01-14 NOTE — [facility] Counselor (Signed)
[facility] Assessment Progress Note  Counselor [facility] [phone]). He requested that a referral packet be faxed to him for review [phone]). Counselor faxed info to WisterMario.  Johny ShockSamantha M. Ladona Ridgelaylor, MS, NCC Disposition Counselor

## 2015-01-14 NOTE — Discharge Summary (Signed)
[facility] OBS UNIT DISCHARGE SUMMARY  Patient Identification: Cody Banks MRN: [id number] Principal Diagnosis: MDD (major depressive disorder), recurrent severe, without psychosis Diagnosis:  Patient Active Problem List   Diagnosis Date Noted  . MDD (major depressive disorder), recurrent severe, without psychosis [F33.2] 01/13/2015    Priority: High  . Polysubstance abuse [F19.10] 01/13/2015    Priority: High  . Substance induced mood disorder [F19.94] 01/13/2015    Priority: High    Total Time spent on patient: 45 minutes  Subjective:  On 01/13/15: Cody Banks is a 34 y.o. male patient admitted with reports of major depression, anxiety, and chronic pain. Pt reports feeling overwhelmed with these stressors and feels unstable. Pt has been taking a friend's neurontin in addition to his own prescribed neurontin for his chronic sciatic pain (left side). Pt's description of symptoms is consistent with sciatic nerve pain that travels down to his foot. Pt has also been taking Zoloft  daily that does not belong to him although he has a history of success with prozac. Discussed the activating features of prozac with pt and he does affirm severe intermittent anxiety, which would may Lexapro more appropriate. Pt denies suicidal/homicidal ideation and psychosis and does not appear to be responding to internal stimuli.   On 01/14/15, pt seen and chart reviewed. Pt spent the night in the [facility] OBS Unit without incident. Pt cites an improvement in anxiety/depression. He continues to present as alert/oriented x4 and has a much more optimistic outlook today, smiling appropriately, and discussing his follow-up plans for psychiatry and counseling. Pt has clear plans, is goal-oriented, and knows his medications. Pt denies suicidal/homicidal ideation and psychosis and does not appear to be responding to internal stimuli. Pt cites good sleep and appetite.   HPI: I have reviewed HPI  below and modified as follows: Cody Banks is an 34 y.o. male who presents as a walk in to [facility] requesting "help" and "someone to talk to".Pt evaluated by [facility] TTS counselor. Patient discussed a history of depression and substance abuse. He discussed feelings of sadness, hopelessness guilft stated that he is currently having symptoms of sadness, crying, insomnia (2-5 hours per night), weight loss (75 pounds the last year), no appetite, and feelings of hopelessness.  Pt admitted to the [facility] OBS Unit for safety, stabilization, and medication titration.   Past Medical History: History reviewed. No pertinent past medical history. History reviewed. No pertinent past surgical history. Family History: History reviewed. No pertinent family history. Social History:  History  Alcohol Use No    History  Drug Use No    History   Social History  . Marital Status: Unknown    Spouse Name: N/A  . Number of Children: N/A  . Years of Education: N/A   Social History Main Topics  . Smoking status: Former Games developer  . Smokeless tobacco: Not on file  . Alcohol Use: No  . Drug Use: No  . Sexual Activity: Not on file   Other Topics Concern  . None   Social History Narrative   Additional Social History:   Pain Medications: (neurotin) History of alcohol / drug use?: Yes (marijuna, opiates, neurotin) Longest period of sobriety (when/how long): (currently using 3 joints of marijuana daily /no opiates in last year, taking 7 - 8 of 300 mg neurotin) Negative Consequences of Use: Work / Programmer, multimedia (stole neurotin from his boss's father in law/ lost trust) Withdrawal Symptoms: (none reported) 1 - Age of First Use: (unknown)  Allergies: No Known Allergies  Labs:    Lab Results Last 48 Hours    No results found for this or any previous visit (from the past 48 hour(s)).    Vitals: There were no  vitals taken for this visit.  Risk to Self: Suicidal Ideation: No Suicidal Intent: No Suicidal Plan?: No What has been your use of drugs/alcohol within the last 12 months?: (marijuna 3 joints daily, neurotin 300mg  7 - 8 pills daily) Other Self Harm Risks: (none) Intentional Self Injurious Behavior: None Risk to Others: Homicidal Ideation: No Thoughts of Harm to Others: No Current Homicidal Plan: No Identified Victim: (N/A) History of harm to others?: No Criminal Charges Pending?: No Does patient have a court date: No Prior Inpatient Therapy: Prior Inpatient Therapy: (wife) Prior Therapy Dates: (none) Prior Therapy Facilty/Provider(s): (none) Prior Outpatient Therapy: Prior Outpatient Therapy: No Prior Therapy Facilty/Provider(s): ([facility]) Reason for Treatment: (Opiate withdrawal and treatment) Does patient have an ACCT team?: No Does patient have Intensive In-House Services? : No Does patient have [facility] services? : No Does patient have [facility] services?: No  Current Facility-Administered Medications  Medication Dose Route Frequency Provider Last Rate Last Dose  . acetaminophen (TYLENOL) tablet 650 mg 650 mg Oral Q6H PRN Beau FannyJohn C Withrow, FNP    . alum & mag hydroxide-simeth (MAALOX/MYLANTA) 200-200-20 MG/5ML suspension 30 mL 30 mL Oral Q4H PRN Beau FannyJohn C Withrow, FNP    . FLUoxetine (PROZAC) capsule 20 mg 20 mg Oral Daily Beau FannyJohn C Withrow, FNP    . hydrOXYzine (ATARAX/VISTARIL) tablet 25 mg 25 mg Oral Q6H PRN Beau FannyJohn C Withrow, FNP    . magnesium hydroxide (MILK OF MAGNESIA) suspension 30 mL 30 mL Oral Daily PRN Beau FannyJohn C Withrow, FNP    . meloxicam (MOBIC) tablet 7.5 mg 7.5 mg Oral Daily Everardo AllJohn C Withrow, FNP    . traZODone (DESYREL) tablet 50 mg 50 mg Oral QHS PRN,MR X 1 Beau FannyJohn C Withrow, FNP      Musculoskeletal: Strength & Muscle Tone: within normal limits Gait & Station: normal Patient leans:  N/A  Psychiatric Specialty Exam: Physical Exam  Review of Systems  Musculoskeletal: Positive for back pain (left with sciatic type radiation down through foot).  Psychiatric/Behavioral: Positive for depression. Negative for suicidal ideas, hallucinations and positive for THC abuse. The patient is nervous/anxious and has insomnia.  All other systems reviewed and are negative.   There were no vitals taken for this visit.There is no weight on file to calculate BMI.  General Appearance: Casual and Fairly Groomed  Patent attorneyye Contact:: Good  Speech: Clear and Coherent and Normal Rate  Volume: Normal  Mood: Euthymic  Affect: Appropriate, Congruent  Thought Process: Coherent and Goal Directed  Orientation: Full (Time, Place, and Person)  Thought Content: WDL  Suicidal Thoughts: No  Homicidal Thoughts: No  Memory: Immediate; Fair Recent; Fair Remote; Fair  Judgement: Fair  Insight: Good  Psychomotor Activity: Normal  Concentration: Good  Recall: Good  Fund of Knowledge:Good  Language: Good  Akathisia: No  Handed:   AIMS (if indicated):    Assets: Communication Skills Desire for Improvement Housing Physical Health Resilience Social Support  ADL's: Intact  Cognition: WNL  Sleep:     Medical Decision Making: Established problem, stable, improving, Review of Psycho-Social Stressors (1), Review or order clinical lab tests (1), Review and summation of old records (2), Review of Medication Regimen & Side Effects (2) and Review of New Medication or Change in Dosage (2)  Treatment Plan Summary: MDD (major depressive  disorder), recurrent severe, without psychosis, improving, will treat as below:  Medications: -Vistaril 25mg  q6h prn anxiety -Lexapro 10mg  daily for anxiety/depression -Neurontin 100mg  tid (was on random low and high doses) -Trazodone 50mg  qhs prn and repeat x1 prn insomnia -Mobic 7.5mg  daily for chronic musculoskeletal  pain  Labs: -CBC w/diff, CMP, TSH, T4 free, UDS, UA w/microscopy, EKG 12-lead, CBG x1; all reviewed/unremarkable  Disposition:  -Discharge home -Prescription for Neurontin, Vistaril, and Trazodone as above (2 weeks only) -[facility] TTS to give pt information about outpatient follow-up and resources.   Beau Fanny, FNP-BC 01/14/2015 12:11 PM     Case reviewed with me as above  Nehemiah Massed, MD
